# Patient Record
Sex: Male | Born: 1980 | Race: White | Hispanic: No | Marital: Married | Smoking: Never smoker
Health system: Southern US, Community
[De-identification: ages and names within clinical notes are randomized; demographics above are authoritative.]

## PROBLEM LIST (undated history)

## (undated) DIAGNOSIS — K8020 Calculus of gallbladder without cholecystitis without obstruction: Secondary | ICD-10-CM

## (undated) HISTORY — PX: NO PAST SURGERIES: SHX2092

---

## 2020-12-01 ENCOUNTER — Other Ambulatory Visit: Payer: Self-pay

## 2020-12-01 ENCOUNTER — Encounter: Payer: Self-pay | Admitting: Emergency Medicine

## 2020-12-01 ENCOUNTER — Emergency Department: Payer: BC Managed Care – PPO

## 2020-12-01 ENCOUNTER — Emergency Department
Admission: EM | Admit: 2020-12-01 | Discharge: 2020-12-01 | Disposition: A | Discharge status: Home / Self Care | Payer: BC Managed Care – PPO | Attending: Emergency Medicine | Admitting: Emergency Medicine

## 2020-12-01 DIAGNOSIS — K8020 Calculus of gallbladder without cholecystitis without obstruction: Secondary | ICD-10-CM

## 2020-12-01 DIAGNOSIS — R1010 Upper abdominal pain, unspecified: Secondary | ICD-10-CM

## 2020-12-01 DIAGNOSIS — R1013 Epigastric pain: Secondary | ICD-10-CM | POA: Diagnosis present

## 2020-12-01 LAB — COMPREHENSIVE METABOLIC PANEL
ALT: 25 U/L (ref 0–44)
AST: 26 U/L (ref 15–41)
Albumin: 4.2 g/dL (ref 3.5–5.0)
Alkaline Phosphatase: 42 U/L (ref 38–126)
Anion gap: 10 (ref 5–15)
BUN: 12 mg/dL (ref 6–20)
CO2: 23 mmol/L (ref 22–32)
Calcium: 9.2 mg/dL (ref 8.9–10.3)
Chloride: 107 mmol/L (ref 98–111)
Creatinine, Ser: 0.84 mg/dL (ref 0.61–1.24)
GFR, Estimated: >60 mL/min (ref >60)
Glucose, Bld: 123 mg/dL — ABNORMAL HIGH (ref 70–99)
Potassium: 3.6 mmol/L (ref 3.5–5.1)
Sodium: 140 mmol/L (ref 135–145)
Total Bilirubin: 0.7 mg/dL (ref 0.3–1.2)
Total Protein: 7.1 g/dL (ref 6.5–8.1)

## 2020-12-01 LAB — CBC WITH DIFFERENTIAL/PLATELET
Abs Immature Granulocytes: 0.07 10*3/uL (ref 0.00–0.07)
Basophils Absolute: 0.1 10*3/uL (ref 0.0–0.1)
Basophils Relative: 0 %
Eosinophils Absolute: 0.1 10*3/uL (ref 0.0–0.5)
Eosinophils Relative: 1 %
HCT: 44.3 % (ref 39.0–52.0)
Hemoglobin: 15.3 g/dL (ref 13.0–17.0)
Immature Granulocytes: 1 %
Lymphocytes Relative: 14 %
Lymphs Abs: 2.2 10*3/uL (ref 0.7–4.0)
MCH: 30.4 pg (ref 26.0–34.0)
MCHC: 34.5 g/dL (ref 30.0–36.0)
MCV: 88.1 fL (ref 80.0–100.0)
Monocytes Absolute: 1.2 10*3/uL — ABNORMAL HIGH (ref 0.1–1.0)
Monocytes Relative: 8 %
Neutro Abs: 11.9 10*3/uL — ABNORMAL HIGH (ref 1.7–7.7)
Neutrophils Relative %: 76 %
Platelets: 378 10*3/uL (ref 150–400)
RBC: 5.03 MIL/uL (ref 4.22–5.81)
RDW: 12.7 % (ref 11.5–15.5)
WBC: 15.5 10*3/uL — ABNORMAL HIGH (ref 4.0–10.5)
nRBC: 0 % (ref 0.0–0.2)

## 2020-12-01 LAB — LIPASE, BLOOD: Lipase: 33 U/L (ref 11–51)

## 2020-12-01 LAB — TROPONIN I (HIGH SENSITIVITY): Troponin I (High Sensitivity): 5 ng/L (ref <18)

## 2020-12-01 MED ORDER — MORPHINE SULFATE (PF) 4 MG/ML IV SOLN
4.0000 mg | Freq: Once | INTRAVENOUS | Status: AC
  Administered 2020-12-01: 4 mg via INTRAVENOUS
  Filled 2020-12-01: qty 1

## 2020-12-01 MED ORDER — ONDANSETRON HCL 4 MG/2ML IJ SOLN
4.0000 mg | Freq: Once | INTRAMUSCULAR | Status: AC
  Administered 2020-12-01: 4 mg via INTRAVENOUS
  Filled 2020-12-01: qty 2

## 2020-12-01 MED ORDER — HYDROMORPHONE HCL 1 MG/ML IJ SOLN
1.0000 mg | Freq: Once | INTRAMUSCULAR | Status: AC
  Administered 2020-12-01: 1 mg via INTRAVENOUS
  Filled 2020-12-01: qty 1

## 2020-12-01 MED ORDER — ONDANSETRON 4 MG PO TBDP: 4.0000 mg | ORAL_TABLET | Freq: Four times a day (QID) | ORAL | 0 refills | Status: AC | PRN

## 2020-12-01 MED ORDER — SODIUM CHLORIDE 0.9 % IV BOLUS (SEPSIS)
1000.0000 mL | Freq: Once | INTRAVENOUS | Status: AC
  Administered 2020-12-01: 1000 mL via INTRAVENOUS

## 2020-12-01 MED ORDER — OXYCODONE-ACETAMINOPHEN 5-325 MG PO TABS: 2.0000 | ORAL_TABLET | Freq: Four times a day (QID) | ORAL | 0 refills | Status: DC | PRN

## 2020-12-01 MED ORDER — PANTOPRAZOLE SODIUM 40 MG IV SOLR
40.0000 mg | Freq: Once | INTRAVENOUS | Status: AC
  Administered 2020-12-01: 40 mg via INTRAVENOUS
  Filled 2020-12-01: qty 40

## 2020-12-01 NOTE — ED Notes (Signed)
Pt given cup of water at this time. Will reassess in a moment.

## 2020-12-01 NOTE — ED Provider Notes (Signed)
[facility] Emergency Department Provider Note  ____________________________________________   Event Date/Time   First MD Initiated Contact with Patient 12/01/20 [phone]     (approximate)  I have reviewed the triage vital signs and the nursing notes.   HISTORY  Chief Complaint Abdominal Pain    HPI Nicholas Gallagher is a 40 y.o. male with no significant past medical history who presents to the emergency department with epigastric pressure that has been ongoing since 10 AM yesterday.  States he has had these intermittent symptoms for the past 5 months.  He denies any known aggravating or alleviating factors. Has tried TUMs intermittently and pepto bismol tonight without relief.  States today symptoms lasted longer than they normally do.  He reports he did eat [employer] for dinner.  He denies fevers, chest pain, shortness of breath, nausea, vomiting, diarrhea, bloody stools, melena, dysuria, hematuria, penile discharge.  No history of previous abdominal surgeries.    History reviewed. No pertinent past medical history.  There are no problems to display for this patient.   History reviewed. No pertinent surgical history.  Prior to Admission medications   Medication Sig Start Date End Date Taking? Authorizing Provider  ondansetron (ZOFRAN ODT) 4 MG disintegrating tablet Take 1 tablet (4 mg total) by mouth every 6 (six) hours as needed for nausea or vomiting. 12/01/20  Yes Rees Santistevan, Layla Maw, DO  oxyCODONE-acetaminophen (PERCOCET) 5-325 MG tablet Take 2 tablets by mouth every 6 (six) hours as needed for severe pain. 12/01/20 12/01/21 Yes Maebell Lyvers, Layla Maw, DO    Allergies Patient has no known allergies.  History reviewed. No pertinent family history.  Social History    Review of Systems Constitutional: No fever. Eyes: No visual changes. ENT: No sore throat. Cardiovascular: Denies chest pain. Respiratory: Denies shortness of breath. Gastrointestinal: No nausea,  vomiting, diarrhea. Genitourinary: Negative for dysuria. Musculoskeletal: Negative for back pain. Skin: Negative for rash. Neurological: Negative for focal weakness or numbness.  ____________________________________________   PHYSICAL EXAM:  VITAL SIGNS: ED Triage Vitals  Enc Vitals Group     BP 12/01/20 0427 (!) 148/48     Pulse Rate 12/01/20 0427 61     Resp 12/01/20 0427 (!) 22     Temp 12/01/20 0427 98.2 F (36.8 C)     Temp Source 12/01/20 0427 Oral     SpO2 12/01/20 0427 99 %     Weight --      Height --      Head Circumference --      Peak Flow --      Pain Score 12/01/20 0426 7     Pain Loc --      Pain Edu? --      Excl. in GC? --    CONSTITUTIONAL: Alert and oriented and responds appropriately to questions.  Appears uncomfortable but is afebrile, nontoxic. HEAD: Normocephalic EYES: Conjunctivae clear, pupils appear equal, EOM appear intact ENT: normal nose; moist mucous membranes NECK: Supple, normal ROM CARD: RRR; S1 and S2 appreciated; no murmurs, no clicks, no rubs, no gallops RESP: Normal chest excursion without splinting or tachypnea; breath sounds clear and equal bilaterally; no wheezes, no rhonchi, no rales, no hypoxia or respiratory distress, speaking full sentences ABD/GI: Normal bowel sounds; non-distended; soft, ender diffusely throughout the upper abdomen with positive Murphy sign, no tenderness at McBurney's point BACK: The back appears normal EXT: Normal ROM in all joints; no deformity noted, no edema; no cyanosis SKIN: Normal color for age and race; warm;  no rash on exposed skin NEURO: Moves all extremities equally PSYCH: The patient's mood and manner are appropriate.  ____________________________________________   LABS (all labs ordered are listed, but only abnormal results are displayed)  Labs Reviewed  COMPREHENSIVE METABOLIC PANEL - Abnormal; Notable for the following components:      Result Value   Glucose, Bld 123 (*)    All other  components within normal limits  CBC WITH DIFFERENTIAL/PLATELET - Abnormal; Notable for the following components:   WBC 15.5 (*)    Neutro Abs 11.9 (*)    Monocytes Absolute 1.2 (*)    All other components within normal limits  LIPASE, BLOOD  TROPONIN I (HIGH SENSITIVITY)   ____________________________________________  EKG   EKG Interpretation  Date/Time:  Sunday Dec 01 2020 04:32:06 EDT Ventricular Rate:  65 PR Interval:  144 QRS Duration: 86 QT Interval:  414 QTC Calculation: 430 R Axis:   72 Text Interpretation: Sinus rhythm with Premature atrial complexes Nonspecific ST abnormality Abnormal ECG Confirmed by Rochele Raring [phone]) on 12/01/2020 4:44:18 AM       ____________________________________________  RADIOLOGY Normajean Baxter Kitara Hebb, personally viewed and evaluated these images (plain radiographs) as part of my medical decision making, as well as reviewing the written report by the radiologist.  ED MD interpretation: Cholelithiasis, stone at the gallbladder neck, no cholecystitis  Official radiology report(s): US Abdomen Limited RUQ (LIVER/GB)  Result Date: 12/01/2020 CLINICAL DATA:  Epigastric pain EXAM: ULTRASOUND ABDOMEN LIMITED RIGHT UPPER QUADRANT COMPARISON:  None. FINDINGS: Gallbladder: Cholelithiasis including an 8 mm stone at the neck. No gallbladder wall thickening but there is focal tenderness by sonographer exam. No pericholecystic fluid. Common bile duct: Diameter: 5 mm. Liver: Echogenic liver with diminished acoustic penetration. Portal vein is patent on color Doppler imaging with normal direction of blood flow towards the liver. Other: None. IMPRESSION: 1. Cholelithiasis including stone at the gallbladder neck. There is focal tenderness which could reflect gallbladder obstruction, but no wall thickening or edema typical of acute cholecystitis. 2. Hepatic steatosis. Electronically Signed   By: Marnee Spring M.D.   On: 12/01/2020 05:46     ____________________________________________   PROCEDURES  Procedure(s) performed (including Critical Care):  Procedures   ____________________________________________   INITIAL IMPRESSION / ASSESSMENT AND PLAN / ED COURSE  As part of my medical decision making, I reviewed the following data within the electronic MEDICAL RECORD NUMBER Nursing notes reviewed and incorporated, Labs reviewed , Old EKG reviewed, Radiograph reviewed , Notes from prior ED visits and [place] Controlled Substance Database         Patient here with complaints of upper abdominal pain.  Differential includes gastritis, GERD, cholecystitis, cholelithiasis, cholangitis, choledocholithiasis, pancreatitis, ACS.  Low suspicion for PE, dissection.  Doubt appendicitis based on exam.  Will obtain labs, urine, right upper quadrant ultrasound.  Will give IV fluids, pain and nausea medicine as well as Protonix.  EKG nonischemic.  ED PROGRESS  Patient has cholelithiasis with a stone at the gallbladder neck which could reflect gallbladder obstruction but there is no wall thickening, edema consistent with cholecystitis.  He does have a leukocytosis with left shift.  Normal LFTs and lipase.  Reports feeling better after morphine and Dilaudid.  Will discuss with general surgery given concerns for possible gallbladder obstruction.  6:54 AM  Discussed with Dr. Aleen Campi with general surgery.  He feels it is reasonable to allow patient to be discharged home with close outpatient follow-up this week given patient is feeling much better.  Patient is  also comfortable with this plan and would prefer discharge home.  Will p.o. challenge.  Anticipate discharge with pain and nausea medicine, instructions on low-fat diet.  7:30 AM  Pt tolerating p.o.  Patient still requesting discharge home with close outpatient follow-up.  Have offered him admission multiple times which he declines.  We discussed at length return precautions.  He seems very  reliable and will return if things worsen.  Will give outpatient general surgery follow-up information.  Will discharge with Percocet, Zofran.  Recommended low-fat diet.   At this time, I do not feel there is any life-threatening condition present. I have reviewed, interpreted and discussed all results (EKG, imaging, lab, urine as appropriate) and exam findings with patient/family. I have reviewed nursing notes and appropriate previous records.  I feel the patient is safe to be discharged home without further emergent workup and can continue workup as an outpatient as needed. Discussed usual and customary return precautions. Patient/family verbalize understanding and are comfortable with this plan.  Outpatient follow-up has been provided as needed. All questions have been answered.  ____________________________________________   FINAL CLINICAL IMPRESSION(S) / ED DIAGNOSES  Final diagnoses:  Upper abdominal pain  Symptomatic cholelithiasis     ED Discharge Orders         Ordered    oxyCODONE-acetaminophen (PERCOCET) 5-325 MG tablet  Every 6 hours PRN        12/01/20 0734    ondansetron (ZOFRAN ODT) 4 MG disintegrating tablet  Every 6 hours PRN        12/01/20 0734          *Please note:  Kaylub Detienne was evaluated in Emergency Department on 12/01/2020 for the symptoms described in the history of present illness. He was evaluated in the context of the global COVID-19 pandemic, which necessitated consideration that the patient might be at risk for infection with the SARS-CoV-2 virus that causes COVID-19. Institutional protocols and algorithms that pertain to the evaluation of patients at risk for COVID-19 are in a state of rapid change based on information released by regulatory bodies including the CDC and federal and state organizations. These policies and algorithms were followed during the patient's care in the ED.  Some ED evaluations and interventions may be delayed as a result of  limited staffing during and the pandemic.*   Note:  This document was prepared using Dragon voice recognition software and may include unintentional dictation errors.   Thresea Doble, Layla Maw, DO 12/01/20 [phone]

## 2020-12-01 NOTE — Discharge Instructions (Addendum)
Please return to the emergency department if you have worsening uncontrolled abdominal pain, yellowing of your skin or eyes, vomiting that does not stop, fever of 100.4 or higher, chest pain or shortness of breath.  I recommend close follow-up with general surgery as an outpatient.  You are being provided a prescription for opiates (also known as narcotics) for pain control.  Opiates can be addictive and should only be used when absolutely necessary for pain control when other alternatives do not work.  We recommend you only use them for the recommended amount of time and only as prescribed.  Please do not take with other sedative medications or alcohol.  Please do not drive, operate machinery, make important decisions while taking opiates.  Please note that these medications can be addictive and have high abuse potential.  Patients can become addicted to narcotics after only taking them for a few days.  Please keep these medications locked away from children, teenagers or any family members with history of substance abuse.  Narcotic pain medicine may also make you constipated.  You may use over-the-counter medications such as MiraLAX, Colace to prevent constipation.  If you become constipated you may use over-the-counter enemas as needed.  Itching and nausea are common side effects of narcotic pain medication.  If you develop uncontrolled vomiting or a rash, please stop these medications.

## 2020-12-01 NOTE — ED Triage Notes (Addendum)
Intermittent epigastric x69months, states since 1000 5/14 pain has not gone away. No medical history. Denies n/v/d/fevers. Pt very uncomfortable, unable to sit still, clammy skin

## 2020-12-02 ENCOUNTER — Telehealth: Payer: Self-pay

## 2020-12-02 ENCOUNTER — Telehealth: Payer: Self-pay | Admitting: Surgery

## 2020-12-02 NOTE — Telephone Encounter (Signed)
Pt's wife, [name], called attempting to schedule a ED f/u w/Dr. Aleen Campi for gallbladder.  Dr. Aleen Campi is overbooked for Wed w/a 1pm surgery scheduled & she stated she has an appt for him elsewhere on Thr, so she will call back later if needed.  Thank you

## 2020-12-02 NOTE — Telephone Encounter (Signed)
-----   Message from Henrene Dodge, MD sent at 12/01/2020  8:38 AM EDT ----- Regarding: ED referral Hi,  This patient was seen in the ED on 5/15 for abdominal pain, cholelithiasis.  Seems to have been having symptoms intermittently for the past 5 months.  Can y'all contact him for follow up with me sometime this week?  He lives in [place] so maybe he can come tomorrow Monday as the last patient of the morning, or Wed/Fri in [place].  Thanks!  [employer]

## 2020-12-02 NOTE — Telephone Encounter (Signed)
LVM for pt to call the office to schedule an appt for cholelithiasis.

## 2020-12-03 ENCOUNTER — Telehealth: Payer: Self-pay

## 2020-12-03 NOTE — Telephone Encounter (Signed)
Spoke with pt and he states he is going to see Dr. Lemar Livings on Thursday 12/05/20.

## 2020-12-05 ENCOUNTER — Other Ambulatory Visit: Payer: Self-pay | Admitting: General Surgery

## 2020-12-05 NOTE — Progress Notes (Signed)
Subjective:     Patient ID: Nicholas Gallagher is a 40 y.o. male.  HPI  The following portions of the patient's history were reviewed and updated as appropriate.  This a new patient is here today for: office visit. The patient is here today for evaluation of right upper quadrant abdominal pain. He was seen in the ED in the early morning hours of Dec 01, 2020.  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  He returned to [facility] prior to presenting to the emergency room with progressive pain the day after onset.. Patient has been referred by Dr. Ellison Hughs.   On reflection, he states he has had these "attacks" on and off since August last year, although not as severe as this past weekend. He does feel like fatty foods trigger episodes. Denies any nausea or vomiting. He did have a fever up to 101 Tuesday night.  This has not recurred.  Patient has been making use of the brat diet since onset of his symptoms and tolerating this well.  No change in bowel function.  No urinary difficulties.  He has made use of Tylenol or ibuprofen for pain in the last 48 hours, using oxycodone at night to "help sleep".  His neighbor and had surgery under my hand in the past.      Chief Complaint  Patient presents with  . Abdominal Pain    RUQ     BP 132/82   Pulse 85   Temp 36.8 C (98.3 F)   Ht 185.4 cm (6' 1" )   Wt (!) 103.4 kg (228 lb)   SpO2 98%   BMI 30.08 kg/m   History reviewed. No pertinent past medical history.        Past Surgical History:  Procedure Laterality Date  . none         Social History          Socioeconomic History  . Marital status: Married  Tobacco Use  . Smoking status: Never Smoker  . Smokeless tobacco: Never Used  Substance and Sexual Activity  . Alcohol use: Yes    Alcohol/week: 1.0 standard drink    Types: 1 Cans of beer per week  . 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 ondansetron (ZOFRAN-ODT) 4 MG disintegrating tablet     . oxyCODONE-acetaminophen (PERCOCET) 5-325 mg tablet TAKE 2 TABLETS BY MOUTH EVERY 6 HOURS AS NEEDED FOR SEVERE PAIN    . polyethylene glycol (MIRALAX) powder Take 17 g by mouth once daily Mix in 4-8ounces of fluid prior to taking.    . cyclobenzaprine (FLEXERIL) 10 MG tablet Take 1 tablet (10 mg total) by mouth 3 (three) times daily as needed for Muscle spasms. (Patient not taking: No sig reported) 40 tablet 0   No current facility-administered medications for this visit.           Family History  Problem Relation Age of Onset  . High blood pressure (Hypertension) Mother   . Glaucoma Mother   . Diabetes type II Mother   . Diabetes type II Maternal Grandfather  Review of Systems  Constitutional: Negative for chills and fever.  Respiratory: Negative for cough.        Objective:   Physical Exam Constitutional:      Appearance: Normal appearance.  Eyes:     Conjunctiva/sclera: Conjunctivae normal.  Cardiovascular:     Rate and Rhythm: Normal rate and regular rhythm.     Pulses: Normal pulses.     Heart sounds: Normal heart sounds.  Pulmonary:     Effort: Pulmonary effort is normal.     Breath sounds: Normal breath sounds.  Abdominal:     General: Abdomen is flat. Bowel sounds are normal.     Palpations: Abdomen is soft.     Tenderness: There is abdominal tenderness in the right upper quadrant.       Comments: Fullness and tenderness in the right upper quadrant suggesting omentum surrounding the gallbladder.  Mild focal tenderness.  No peritoneal irritation, guarding or referred pain.  Neurological:     Mental  Status: He is alert and oriented to person, place, and time.  Psychiatric:        Mood and Affect: Mood normal.        Behavior: Behavior normal.    Labs and Radiology:   Abdominal ultrasound dated Dec 01, 2020:  IMPRESSION: 1. Cholelithiasis including stone at the gallbladder neck. There is focal tenderness which could reflect gallbladder obstruction, but no wall thickening or edema typical of acute cholecystitis. 2. Hepatic steatosis.  Dec 03, 2020 laboratory:  WBC [facility] Blood Cell Count) 4.1 - 10.2 10^3/uL 14.9High   RBC (Red Blood Cell Count) 4.69 - 6.13 10^6/uL 5.19   Hemoglobin 14.1 - 18.1 gm/dL 15.9   Hematocrit 40.0 - 52.0 % 46.1   MCV (Mean Corpuscular Volume) 80.0 - 100.0 fl 88.8   MCH (Mean Corpuscular Hemoglobin) 27.0 - 31.2 pg 30.6   MCHC (Mean Corpuscular Hemoglobin Concentration) 32.0 - 36.0 gm/dL 34.5   Platelet Count 150 - 450 10^3/uL 361   RDW-CV (Red Cell Distribution Width) 11.6 - 14.8 % 12.3   MPV (Mean Platelet Volume) 9.4 - 12.4 fl 9.2Low   Neutrophils 1.50 - 7.80 10^3/uL 10.60High   Lymphocytes 1.00 - 3.60 10^3/uL 2.10   Mixed Count 0.10 - 0.90 10^3/uL 2.20High   Neutrophil % 32.0 - 70.0 % 71.4High   Lymphocyte % 10.0 - 50.0 % 14.0   Mixed % 3.0 - 14.4 % 14.6High    Glucose 70 - 110 mg/dL 102   Sodium 136 - 145 mmol/L 137   Potassium 3.6 - 5.1 mmol/L 4.4   Chloride 97 - 109 mmol/L 96Low   Carbon Dioxide (CO2) 22.0 - 32.0 mmol/L 34.7High   Urea Nitrogen (BUN) 7 - 25 mg/dL 11   Creatinine 0.7 - 1.3 mg/dL 1.0   Glomerular Filtration Rate (eGFR), MDRD Estimate >60 mL/min/1.73sq m 82   Calcium 8.7 - 10.3 mg/dL 9.3   AST  8 - 39 U/L 33   ALT  6 - 57 U/L 54   Alk Phos (alkaline Phosphatase) 34 - 104 U/L 77   Albumin 3.5 - 4.8 g/dL 4.4   Bilirubin, Total 0.3 - 1.2 mg/dL 1.2   Protein, Total 6.1 - 7.9 g/dL 7.3   A/G Ratio 1.0 - 5.0 gm/dL 1.5    Lipase 11 - 82 U/L 18      Dec 01, 2020 laboratory:  WBC 4.0 - 10.5 K/uL  15.5High   RBC 4.22 - 5.81 MIL/uL 5.03   Hemoglobin 13.0 - 17.0 g/dL  15.3   HCT 39.0 - 52.0 % 44.3   MCV 80.0 - 100.0 fL 88.1   MCH 26.0 - 34.0 pg 30.4   MCHC 30.0 - 36.0 g/dL 34.5   RDW 11.5 - 15.5 % 12.7   Platelets 150 - 400 K/uL 378   nRBC 0.0 - 0.2 % 0.0   Neutrophils Relative % % 76   Neutro Abs 1.7 - 7.7 K/uL 11.9High   Lymphocytes Relative % 14   Lymphs Abs 0.7 - 4.0 K/uL 2.2   Monocytes Relative % 8   Monocytes Absolute 0.1 - 1.0 K/uL 1.2High   Eosinophils Relative % 1   Eosinophils Absolute 0.0 - 0.5 K/uL 0.1   Basophils Relative % 0   Basophils Absolute 0.0 - 0.1 K/uL 0.1   Immature Granulocytes % 1   Abs Immature Granulocytes 0.00 - 0.07 K/uL 0.07   Comment: Performed at [facility], [address] [place], [place] [address]   Sodium 135 - 145 mmol/L 140   Potassium 3.5 - 5.1 mmol/L 3.6   Chloride 98 - 111 mmol/L 107   CO2 22 - 32 mmol/L 23   Glucose, Bld 70 - 99 mg/dL 123High   Comment: Glucose reference range applies only to samples taken after fasting for at least 8 hours.  BUN 6 - 20 mg/dL 12   Creatinine, Ser 0.61 - 1.24 mg/dL 0.84   Calcium 8.9 - 10.3 mg/dL 9.2   Total Protein 6.5 - 8.1 g/dL 7.1   Albumin 3.5 - 5.0 g/dL 4.2   AST 15 - 41 U/L 26   ALT 0 - 44 U/L 25   Alkaline Phosphatase 38 - 126 U/L 42   Total Bilirubin 0.3 - 1.2 mg/dL 0.7   GFR, Estimated >60 mL/min >60   Comment: (NOTE)  Calculated using the CKD-EPI Creatinine Equation (2021)        Assessment:     Acute on chronic cholecystitis, now day 5.    Plan:     This is the worst time to intervene operatively, especially considering the likely inflammatory phlegmon in the right upper quadrant.  As the patient is tolerating his diet well, has a slightly diminished white blood cell count left shift, I think we can work for a couple of days of "cooling off".  While I normally would give this a few weeks to quiet down, he taken off next week and had plans for travel out  of state to a memorial in [place] [facility].  The patient has been asked to make use of either Aleve 2 tablets twice a day or ibuprofen 4 tablets 3 times a day for the next few days to see if we get the inflammatory process quiescent.  As he has had no recurrent fevers I do not think antibiotics are required.  The role of operative intervention is appropriate considering his young age and long history of symptoms over the last 9 months.  The plan for a laparoscopic procedure was reviewed, he is aware that if necessary an open procedure will be undertaken.  Anticipated return to work in about 7 days.  Driving when pain-free.  He was encouraged to call if he becomes more symptomatic between now and his scheduled surgery date on May 25, otherwise we will get together at that time.     This note is partially prepared by Ledell Noss, CMA acting as a scribe in the presence of Dr. Hervey Ard, MD.  This note is partially  prepared by Karie Fetch, RN, acting as a scribe in the presence of Dr. Hervey Ard, MD.   The documentation recorded by the scribe accurately reflects the service I personally performed and the decisions made by me.   Robert Bellow, MD FACS

## 2020-12-10 ENCOUNTER — Other Ambulatory Visit: Payer: Self-pay

## 2020-12-10 ENCOUNTER — Other Ambulatory Visit
Admission: RE | Admit: 2020-12-10 | Discharge: 2020-12-10 | Disposition: A | Discharge status: Home / Self Care | Payer: BC Managed Care – PPO | Source: Ambulatory Visit | Attending: General Surgery | Admitting: General Surgery

## 2020-12-10 HISTORY — DX: Calculus of gallbladder without cholecystitis without obstruction: K80.20

## 2020-12-10 MED ORDER — CEFAZOLIN SODIUM-DEXTROSE 2-4 GM/100ML-% IV SOLN
2.0000 g | INTRAVENOUS | Status: AC
  Administered 2020-12-11: 2 g via INTRAVENOUS

## 2020-12-10 MED ORDER — CHLORHEXIDINE GLUCONATE 0.12 % MT SOLN: 15.0000 mL | Freq: Once | OROMUCOSAL | Status: AC

## 2020-12-10 MED ORDER — CHLORHEXIDINE GLUCONATE CLOTH 2 % EX PADS: 6.0000 | MEDICATED_PAD | Freq: Once | CUTANEOUS | Status: DC

## 2020-12-10 MED ORDER — CHLORHEXIDINE GLUCONATE CLOTH 2 % EX PADS
6.0000 | MEDICATED_PAD | Freq: Once | CUTANEOUS | Status: DC
Start: 2020-12-10 — End: 2020-12-11

## 2020-12-10 MED ORDER — LACTATED RINGERS IV SOLN: INTRAVENOUS | Status: DC

## 2020-12-10 MED ORDER — ORAL CARE MOUTH RINSE: 15.0000 mL | Freq: Once | OROMUCOSAL | Status: AC

## 2020-12-10 MED ORDER — FAMOTIDINE 20 MG PO TABS: 20.0000 mg | ORAL_TABLET | Freq: Once | ORAL | Status: DC

## 2020-12-10 NOTE — Patient Instructions (Addendum)
Your procedure is scheduled on: Wednesday, May 25 Report to the Registration Desk on the 1st floor of the [facility] To find out your arrival time, please call [phone] between 1PM - 3PM on: Tuesday, May 24  REMEMBER: Instructions that are not followed completely may result in serious medical risk, up to and including death; or upon the discretion of your surgeon and anesthesiologist your surgery may need to be rescheduled.  Do not eat food after midnight the night before surgery.  No gum chewing, lozengers or hard candies.  You may however, drink CLEAR liquids up to 2 hours before you are scheduled to arrive for your surgery. Do not drink anything within 2 hours of your scheduled arrival time.  Clear liquids include: - water  - apple juice without pulp - gatorade (not RED, PURPLE, OR BLUE) - black coffee or tea (Do NOT add milk or creamers to the coffee or tea) Do NOT drink anything that is not on this list.  DO NOT TAKE ANY MEDICATIONS THE MORNING OF SURGERY   One week prior to surgery: Stop Anti-inflammatories (NSAIDS) such as Advil, Aleve, Ibuprofen, Motrin, Naproxen, Naprosyn and Aspirin based products such as Excedrin, Goodys Powder, BC Powder. Stop ANY OVER THE COUNTER supplements until after surgery.  No Alcohol for 24 hours before or after surgery.  No Smoking including e-cigarettes for 24 hours prior to surgery.  No chewable tobacco products for at least 6 hours prior to surgery.  No nicotine patches on the day of surgery.  Do not use any "recreational" drugs for at least a week prior to your surgery.  Please be advised that the combination of cocaine and anesthesia may have negative outcomes, up to and including death. If you test positive for cocaine, your surgery will be cancelled.  On the morning of surgery brush your teeth with toothpaste and water, you may rinse your mouth with mouthwash if you wish. Do not swallow any toothpaste or mouthwash.  Do not  wear jewelry, make-up, hairpins, clips or nail polish.  Do not wear lotions, powders, or perfumes.   Do not shave body from the neck down 48 hours prior to surgery just in case you cut yourself which could leave a site for infection.   Contact lenses, hearing aids and dentures may not be worn into surgery.  Do not bring valuables to the hospital. [facility] is not responsible for any missing/lost belongings or valuables.   Notify your doctor if there is any change in your medical condition (cold, fever, infection).  Wear comfortable clothing (specific to your surgery type) to the hospital.  Plan for stool softeners for home use; pain medications have a tendency to cause constipation. You can also help prevent constipation by eating foods high in fiber such as fruits and vegetables and drinking plenty of fluids as your diet allows.  After surgery, you can help prevent lung complications by doing breathing exercises.  Take deep breaths and cough every 1-2 hours. Your doctor may order a device called an Incentive Spirometer to help you take deep breaths. When coughing or sneezing, hold a pillow firmly against your incision with both hands. This is called "splinting." Doing this helps protect your incision. It also decreases belly discomfort.  If you are being discharged the day of surgery, you will not be allowed to drive home. You will need a responsible adult (18 years or older) to drive you home and stay with you that night.   If you are  taking public transportation, you will need to have a responsible adult (18 years or older) with you. Please confirm with your physician that it is acceptable to use public transportation.   Please call the [facility] at [phone] if you have any questions about these instructions.  Surgery Visitation Policy:  Patients undergoing a surgery or procedure may have one family member or support person with them as long as that person  is not COVID-19 positive or experiencing its symptoms.  That person may remain in the waiting area during the procedure.

## 2020-12-11 ENCOUNTER — Other Ambulatory Visit: Payer: Self-pay

## 2020-12-11 ENCOUNTER — Ambulatory Visit: Payer: BC Managed Care – PPO | Admitting: Urgent Care

## 2020-12-11 ENCOUNTER — Inpatient Hospital Stay
Admission: EM | Admit: 2020-12-11 | Discharge: 2020-12-21 | DRG: 939 | Disposition: A | Discharge status: Home / Self Care | Payer: BC Managed Care – PPO | Attending: General Surgery | Admitting: General Surgery

## 2020-12-11 ENCOUNTER — Encounter
Admission: RE | Disposition: A | Discharge status: Home / Self Care | Payer: Self-pay | Source: Home / Self Care | Attending: General Surgery

## 2020-12-11 ENCOUNTER — Encounter: Payer: Self-pay | Admitting: General Surgery

## 2020-12-11 ENCOUNTER — Ambulatory Visit: Payer: BC Managed Care – PPO | Admitting: Anesthesiology

## 2020-12-11 ENCOUNTER — Ambulatory Visit
Admission: RE | Admit: 2020-12-11 | Discharge: 2020-12-11 | Disposition: A | Discharge status: Home / Self Care | Payer: BC Managed Care – PPO | Source: Home / Self Care | Attending: General Surgery | Admitting: General Surgery

## 2020-12-11 DIAGNOSIS — T402X5A Adverse effect of other opioids, initial encounter: Secondary | ICD-10-CM | POA: Diagnosis not present

## 2020-12-11 DIAGNOSIS — G8929 Other chronic pain: Secondary | ICD-10-CM

## 2020-12-11 DIAGNOSIS — K8012 Calculus of gallbladder with acute and chronic cholecystitis without obstruction: Secondary | ICD-10-CM | POA: Insufficient documentation

## 2020-12-11 DIAGNOSIS — K5903 Drug induced constipation: Secondary | ICD-10-CM | POA: Diagnosis not present

## 2020-12-11 DIAGNOSIS — Y838 Other surgical procedures as the cause of abnormal reaction of the patient, or of later complication, without mention of misadventure at the time of the procedure: Secondary | ICD-10-CM | POA: Diagnosis present

## 2020-12-11 DIAGNOSIS — R7989 Other specified abnormal findings of blood chemistry: Secondary | ICD-10-CM

## 2020-12-11 DIAGNOSIS — K858 Other acute pancreatitis without necrosis or infection: Secondary | ICD-10-CM | POA: Diagnosis not present

## 2020-12-11 DIAGNOSIS — J90 Pleural effusion, not elsewhere classified: Secondary | ICD-10-CM | POA: Diagnosis not present

## 2020-12-11 DIAGNOSIS — G8918 Other acute postprocedural pain: Principal | ICD-10-CM | POA: Diagnosis present

## 2020-12-11 DIAGNOSIS — K8019 Calculus of gallbladder with other cholecystitis with obstruction: Secondary | ICD-10-CM

## 2020-12-11 DIAGNOSIS — K567 Ileus, unspecified: Secondary | ICD-10-CM

## 2020-12-11 DIAGNOSIS — K838 Other specified diseases of biliary tract: Secondary | ICD-10-CM

## 2020-12-11 DIAGNOSIS — K9189 Other postprocedural complications and disorders of digestive system: Secondary | ICD-10-CM | POA: Diagnosis not present

## 2020-12-11 DIAGNOSIS — Z79899 Other long term (current) drug therapy: Secondary | ICD-10-CM

## 2020-12-11 DIAGNOSIS — R1011 Right upper quadrant pain: Secondary | ICD-10-CM

## 2020-12-11 DIAGNOSIS — J9811 Atelectasis: Secondary | ICD-10-CM

## 2020-12-11 DIAGNOSIS — R0902 Hypoxemia: Secondary | ICD-10-CM | POA: Diagnosis not present

## 2020-12-11 DIAGNOSIS — E871 Hypo-osmolality and hyponatremia: Secondary | ICD-10-CM | POA: Diagnosis not present

## 2020-12-11 DIAGNOSIS — Y733 Surgical instruments, materials and gastroenterology and urology devices (including sutures) associated with adverse incidents: Secondary | ICD-10-CM | POA: Diagnosis present

## 2020-12-11 DIAGNOSIS — Z20822 Contact with and (suspected) exposure to covid-19: Secondary | ICD-10-CM | POA: Diagnosis present

## 2020-12-11 DIAGNOSIS — Z419 Encounter for procedure for purposes other than remedying health state, unspecified: Secondary | ICD-10-CM

## 2020-12-11 HISTORY — PX: CHOLECYSTECTOMY: SHX55

## 2020-12-11 SURGERY — LAPAROSCOPIC CHOLECYSTECTOMY WITH INTRAOPERATIVE CHOLANGIOGRAM
Anesthesia: General | Site: Abdomen

## 2020-12-11 MED ORDER — SUGAMMADEX SODIUM 200 MG/2ML IV SOLN
INTRAVENOUS | Status: DC | PRN
  Administered 2020-12-11 (×2): 200 mg via INTRAVENOUS

## 2020-12-11 MED ORDER — FENTANYL CITRATE (PF) 100 MCG/2ML IJ SOLN
INTRAMUSCULAR | Status: DC | PRN
  Administered 2020-12-11 (×3): 50 ug via INTRAVENOUS

## 2020-12-11 MED ORDER — LIDOCAINE HCL (CARDIAC) PF 100 MG/5ML IV SOSY
PREFILLED_SYRINGE | INTRAVENOUS | Status: DC | PRN
  Administered 2020-12-11: 80 mg via INTRAVENOUS

## 2020-12-11 MED ORDER — MIDAZOLAM HCL 2 MG/2ML IJ SOLN
INTRAMUSCULAR | Status: DC | PRN
  Administered 2020-12-11: 2 mg via INTRAVENOUS

## 2020-12-11 MED ORDER — FENTANYL CITRATE (PF) 100 MCG/2ML IJ SOLN
INTRAMUSCULAR | Status: AC
  Administered 2020-12-11: 50 ug via INTRAVENOUS
  Filled 2020-12-11: qty 2

## 2020-12-11 MED ORDER — ACETAMINOPHEN 10 MG/ML IV SOLN
INTRAVENOUS | Status: DC | PRN
  Administered 2020-12-11: 1000 mg via INTRAVENOUS

## 2020-12-11 MED ORDER — CHLORHEXIDINE GLUCONATE 0.12 % MT SOLN
OROMUCOSAL | Status: AC
  Administered 2020-12-11: 15 mL via OROMUCOSAL
  Filled 2020-12-11: qty 15

## 2020-12-11 MED ORDER — FAMOTIDINE 20 MG PO TABS
ORAL_TABLET | ORAL | Status: AC
  Filled 2020-12-11: qty 1

## 2020-12-11 MED ORDER — FENTANYL CITRATE (PF) 100 MCG/2ML IJ SOLN
25.0000 ug | INTRAMUSCULAR | Status: DC | PRN
  Administered 2020-12-11 (×3): 25 ug via INTRAVENOUS

## 2020-12-11 MED ORDER — KETOROLAC TROMETHAMINE 30 MG/ML IJ SOLN
INTRAMUSCULAR | Status: DC | PRN
  Administered 2020-12-11: 30 mg via INTRAVENOUS

## 2020-12-11 MED ORDER — OXYCODONE HCL 5 MG/5ML PO SOLN
5.0000 mg | Freq: Once | ORAL | Status: AC | PRN
Start: 2020-12-11 — End: 2020-12-11

## 2020-12-11 MED ORDER — ACETAMINOPHEN 10 MG/ML IV SOLN: 1000.0000 mg | Freq: Once | INTRAVENOUS | Status: DC | PRN

## 2020-12-11 MED ORDER — LACTATED RINGERS IV SOLN: INTRAVENOUS | Status: DC | PRN

## 2020-12-11 MED ORDER — ONDANSETRON HCL 4 MG/2ML IJ SOLN
INTRAMUSCULAR | Status: DC | PRN
  Administered 2020-12-11: 4 mg via INTRAVENOUS

## 2020-12-11 MED ORDER — OXYCODONE HCL 5 MG PO TABS
ORAL_TABLET | ORAL | Status: AC
  Administered 2020-12-11: 5 mg via ORAL
  Filled 2020-12-11: qty 1

## 2020-12-11 MED ORDER — FENTANYL CITRATE (PF) 100 MCG/2ML IJ SOLN
INTRAMUSCULAR | Status: AC
  Administered 2020-12-11: 25 ug via INTRAVENOUS
  Filled 2020-12-11: qty 2

## 2020-12-11 MED ORDER — HYDROMORPHONE HCL 1 MG/ML IJ SOLN
0.5000 mg | Freq: Once | INTRAMUSCULAR | Status: AC
  Administered 2020-12-12: 0.5 mg via INTRAVENOUS
  Filled 2020-12-11: qty 1

## 2020-12-11 MED ORDER — PROPOFOL 10 MG/ML IV BOLUS
INTRAVENOUS | Status: DC | PRN
  Administered 2020-12-11: 200 mg via INTRAVENOUS

## 2020-12-11 MED ORDER — ONDANSETRON HCL 4 MG/2ML IJ SOLN
4.0000 mg | Freq: Once | INTRAMUSCULAR | Status: AC
  Administered 2020-12-12: 4 mg via INTRAVENOUS
  Filled 2020-12-11: qty 2

## 2020-12-11 MED ORDER — CIPROFLOXACIN HCL 500 MG PO TABS: 500.0000 mg | ORAL_TABLET | Freq: Two times a day (BID) | ORAL | 0 refills | Status: DC

## 2020-12-11 MED ORDER — DEXAMETHASONE SODIUM PHOSPHATE 10 MG/ML IJ SOLN
INTRAMUSCULAR | Status: DC | PRN
  Administered 2020-12-11: 10 mg via INTRAVENOUS

## 2020-12-11 MED ORDER — CEFAZOLIN SODIUM-DEXTROSE 2-4 GM/100ML-% IV SOLN
INTRAVENOUS | Status: AC
  Filled 2020-12-11: qty 100

## 2020-12-11 MED ORDER — FENTANYL CITRATE (PF) 100 MCG/2ML IJ SOLN
50.0000 ug | Freq: Once | INTRAMUSCULAR | Status: AC
Start: 2020-12-11 — End: 2020-12-11

## 2020-12-11 MED ORDER — SODIUM CHLORIDE (PF) 0.9 % IJ SOLN
INTRAMUSCULAR | Status: AC
  Filled 2020-12-11: qty 50

## 2020-12-11 MED ORDER — OXYCODONE HCL 5 MG PO TABS: 5.0000 mg | ORAL_TABLET | Freq: Once | ORAL | Status: AC | PRN

## 2020-12-11 MED ORDER — ROCURONIUM BROMIDE 100 MG/10ML IV SOLN
INTRAVENOUS | Status: DC | PRN
  Administered 2020-12-11: 20 mg via INTRAVENOUS
  Administered 2020-12-11: 60 mg via INTRAVENOUS

## 2020-12-11 MED ORDER — ONDANSETRON HCL 4 MG/2ML IJ SOLN
4.0000 mg | Freq: Once | INTRAMUSCULAR | Status: AC | PRN
  Administered 2020-12-11: 4 mg via INTRAVENOUS

## 2020-12-11 MED ORDER — ONDANSETRON HCL 4 MG/2ML IJ SOLN
INTRAMUSCULAR | Status: AC
  Filled 2020-12-11: qty 2

## 2020-12-11 MED ORDER — SODIUM CHLORIDE 0.9 % IV BOLUS
1000.0000 mL | Freq: Once | INTRAVENOUS | Status: AC
  Administered 2020-12-12: 1000 mL via INTRAVENOUS

## 2020-12-11 SURGICAL SUPPLY — 44 items
APL PRP STRL LF DISP 70% ISPRP (MISCELLANEOUS) ×1
APPLIER CLIP ROT 10 11.4 M/L (STAPLE) ×2
APR CLP MED LRG 11.4X10 (STAPLE) ×1
BAG SPEC RTRVL LRG 6X4 10 (ENDOMECHANICALS)
BLADE SURG 11 STRL SS SAFETY (MISCELLANEOUS) ×2 IMPLANT
CANISTER SUCT 1200ML W/VALVE (MISCELLANEOUS) ×2 IMPLANT
CANNULA DILATOR 10 W/SLV (CANNULA) ×2 IMPLANT
CANNULA DILATOR 5 W/SLV (CANNULA) ×5 IMPLANT
CATH CHOLANG 76X19 KUMAR (CATHETERS) ×2 IMPLANT
CHLORAPREP W/TINT 26 (MISCELLANEOUS) ×2 IMPLANT
CLIP APPLIE ROT 10 11.4 M/L (STAPLE) IMPLANT
COVER WAND RF STERILE (DRAPES) ×2 IMPLANT
DRAPE C-ARM 42X70 (DRAPES) ×2 IMPLANT
DRSG TEGADERM 2-3/8X2-3/4 SM (GAUZE/BANDAGES/DRESSINGS) ×8 IMPLANT
DRSG TELFA 4X3 1S NADH ST (GAUZE/BANDAGES/DRESSINGS) ×2 IMPLANT
ELECT REM PT RETURN 9FT ADLT (ELECTROSURGICAL) ×2
ELECTRODE REM PT RTRN 9FT ADLT (ELECTROSURGICAL) ×1 IMPLANT
GLOVE SURG ENC MOIS LTX SZ7.5 (GLOVE) ×2 IMPLANT
GLOVE SURG UNDER LTX SZ8 (GLOVE) ×2 IMPLANT
GOWN STRL REUS W/ TWL LRG LVL3 (GOWN DISPOSABLE) ×3 IMPLANT
GOWN STRL REUS W/TWL LRG LVL3 (GOWN DISPOSABLE) ×6
IRRIGATION STRYKERFLOW (MISCELLANEOUS) ×1 IMPLANT
IRRIGATOR STRYKERFLOW (MISCELLANEOUS) ×2
IV LACTATED RINGERS 1000ML (IV SOLUTION) ×2 IMPLANT
KIT TURNOVER KIT A (KITS) ×2 IMPLANT
KITTNER LAPARASCOPIC 5X40 (MISCELLANEOUS) ×1 IMPLANT
LABEL OR SOLS (LABEL) ×2 IMPLANT
MANIFOLD NEPTUNE II (INSTRUMENTS) ×2 IMPLANT
NDL INSUFF ACCESS 14 VERSASTEP (NEEDLE) ×2 IMPLANT
NS IRRIG 500ML POUR BTL (IV SOLUTION) ×2 IMPLANT
PACK LAP CHOLECYSTECTOMY (MISCELLANEOUS) ×2 IMPLANT
PDS 0 ct-2 ×1 IMPLANT
POUCH SPECIMEN RETRIEVAL 10MM (ENDOMECHANICALS) IMPLANT
SCISSORS METZENBAUM CVD 33 (INSTRUMENTS) ×2 IMPLANT
SET TUBE SMOKE EVAC HIGH FLOW (TUBING) ×2 IMPLANT
SLEEVE VERSASTEP EXPAND ONEST (MISCELLANEOUS) ×1 IMPLANT
SPONGE KITTNER 5P (MISCELLANEOUS) IMPLANT
STRIP CLOSURE SKIN 1/2X4 (GAUZE/BANDAGES/DRESSINGS) ×2 IMPLANT
SUT VIC AB 0 CT2 27 (SUTURE) IMPLANT
SUT VIC AB 4-0 FS2 27 (SUTURE) ×2 IMPLANT
SWAB CULTURE AMIES ANAERIB BLU (MISCELLANEOUS) ×1 IMPLANT
SWABSTK COMLB BENZOIN TINCTURE (MISCELLANEOUS) ×2 IMPLANT
TROCAR XCEL NON-BLD 11X100MML (ENDOMECHANICALS) ×2 IMPLANT
WATER STERILE IRR 1000ML POUR (IV SOLUTION) ×2 IMPLANT

## 2020-12-11 NOTE — ED Triage Notes (Signed)
Pt had gall bladder removed today here for worsening abd pain and right neck pain.

## 2020-12-11 NOTE — Transfer of Care (Signed)
Immediate Anesthesia Transfer of Care Note  Patient: Nicholas Gallagher  Procedure(s) Performed: LAPAROSCOPIC CHOLECYSTECTOMY WITH INTRAOPERATIVE CHOLANGIOGRAM (N/A Abdomen)  Patient Location: PACU  Anesthesia Type:General  Level of Consciousness: awake, drowsy and patient cooperative  Airway & Oxygen Therapy: Patient Spontanous Breathing  Post-op Assessment: Report given to RN and Post -op Vital signs reviewed and stable  Post vital signs: Reviewed and stable  Last Vitals:  Vitals Value Taken Time  BP 140/95 12/11/20 1510  Temp    Pulse 87 12/11/20 1514  Resp 0 12/11/20 1514  SpO2 97 % 12/11/20 1514  Vitals shown include unvalidated device data.  Last Pain:  Vitals:   12/11/20 1202  TempSrc:   PainSc: 1       Patients Stated Pain Goal: 2 (12/11/20 1130)  Complications: No complications documented.

## 2020-12-11 NOTE — Discharge Instructions (Signed)
AMBULATORY SURGERY  °DISCHARGE INSTRUCTIONS ° ° °1) The drugs that you were given will stay in your system until tomorrow so for the next 24 hours you should not: ° °A) Drive an automobile °B) Make any legal decisions °C) Drink any alcoholic beverage ° ° °2) You may resume regular meals tomorrow.  Today it is better to start with liquids and gradually work up to solid foods. ° °You may eat anything you prefer, but it is better to start with liquids, then soup and crackers, and gradually work up to solid foods. ° ° °3) Please notify your doctor immediately if you have any unusual bleeding, trouble breathing, redness and pain at the surgery site, drainage, fever, or pain not relieved by medication. ° ° ° °4) Additional Instructions: ° ° ° ° ° ° ° °Please contact your physician with any problems or Same Day Surgery at [phone], Monday through Friday 6 am to 4 pm, or [place] [facility] [place] Main number at [phone]. °

## 2020-12-11 NOTE — ED Provider Notes (Signed)
[facility] Emergency Department Provider Note  ____________________________________________   Event Date/Time   First MD Initiated Contact with Patient 12/11/20 2343     (approximate)  I have reviewed the triage vital signs and the nursing notes.   HISTORY  Chief Complaint Abdominal Pain    HPI Nicholas Gallagher is a 40 y.o. male who underwent laparoscopic cholecystectomy today by Dr. Lemar Livings.  Patient states that his surgery was done today.  He was discharged around 5 PM.  He states that he has taken 2 of the oxycodone but continued to have severe pain mostly in the right upper quadrant, constant, nothing makes better, nothing makes it worse.  States the pain was so severe he now thinks that he sprained something in his neck.  He does not feel short of breath but states that he is just splinting due to how bad the pain is.  Patient reports urinating since surgery.  He denies bowel movement yet.  He is not sure if he is passing gas.          Past Medical History:  Diagnosis Date  . Gallstones     There are no problems to display for this patient.   Past Surgical History:  Procedure Laterality Date  . NO PAST SURGERIES      Prior to Admission medications   Medication Sig Start Date End Date Taking? Authorizing Provider  ciprofloxacin (CIPRO) 500 MG tablet Take 1 tablet (500 mg total) by mouth 2 (two) times daily for 7 days. 12/11/20 12/18/20  Earline Mayotte, MD  Multiple Vitamins-Minerals (ADULT GUMMY PO) Take 2 capsules by mouth daily.    [provider]  naproxen sodium (ALEVE) 220 MG tablet Take 220 mg by mouth 2 (two) times daily as needed (pain).    [provider]  ondansetron (ZOFRAN ODT) 4 MG disintegrating tablet Take 1 tablet (4 mg total) by mouth every 6 (six) hours as needed for nausea or vomiting. Patient not taking: No sig reported 12/01/20   Ward, Layla Maw, DO  oxyCODONE-acetaminophen (PERCOCET) 5-325 MG tablet Take  2 tablets by mouth every 6 (six) hours as needed for severe pain. Patient not taking: No sig reported 12/01/20 12/01/21  Ward, Layla Maw, DO    Allergies Patient has no known allergies.  No family history on file.  Social History Social History   Tobacco Use  . Smoking status: Never Smoker  . Smokeless tobacco: Never Used  Vaping Use  . Vaping Use: Never used  Substance Use Topics  . Alcohol use: Yes    Comment: occassional  . Drug use: Never      Review of Systems Constitutional: No fever/chills Eyes: No visual changes. ENT: No sore throat. Cardiovascular: Denies chest pain. Respiratory: Denies shortness of breath. Gastrointestinal: Positive abdominal pain Genitourinary: Negative for dysuria. Musculoskeletal: Negative for back pain.  Right shoulder pain Skin: Negative for rash. Neurological: Negative for headaches, focal weakness or numbness. All other ROS negative ____________________________________________   PHYSICAL EXAM:  VITAL SIGNS: Blood pressure (!) 173/113, pulse (!) 112, temperature 97.9 F (36.6 C), temperature source Oral, resp. rate 20, height 6\' 1"  (1.854 m), weight 102.1 kg, SpO2 99 %.  Constitutional: Alert and oriented. Well appearing and in no acute distress. Eyes: Conjunctivae are normal. EOMI. Head: Atraumatic. Nose: No congestion/rhinnorhea. Mouth/Throat: Mucous membranes are moist.   Neck: No stridor. Trachea Midline. FROM some right frontal neck pain going down into the right shoulder Cardiovascular: Tachycardic, regular rhythm. Grossly normal  heart sounds.  Good peripheral circulation. Respiratory: Normal respiratory effort.  No retractions. Lungs CTAB. Gastrointestinal: Right upper quadrant pain.  No distention. No abdominal bruits.  Musculoskeletal: No lower extremity tenderness nor edema.  No joint effusions. Neurologic:  Normal speech and language. No gross focal neurologic deficits are appreciated.  Skin:  Skin is warm, dry and  intact. No rash noted. Psychiatric: Mood and affect are normal. Speech and behavior are normal. GU: Deferred   ____________________________________________   LABS (all labs ordered are listed, but only abnormal results are displayed)  Labs Reviewed  CBC WITH DIFFERENTIAL/PLATELET - Abnormal; Notable for the following components:      Result Value   WBC 12.3 (*)    MCHC 36.3 (*)    Platelets 662 (*)    Neutro Abs 10.4 (*)    Abs Immature Granulocytes 0.08 (*)    All other components within normal limits  COMPREHENSIVE METABOLIC PANEL  LIPASE, BLOOD  URINALYSIS, COMPLETE (UACMP) WITH MICROSCOPIC  TROPONIN I (HIGH SENSITIVITY)   ____________________________________________   ED ECG REPORT I, Concha Se, the attending physician, personally viewed and interpreted this ECG.  Sinus tachycardia rate of 104, no ST elevation, no T wave inversions, normal intervals ____________________________________________  RADIOLOGY Vela Prose, personally viewed and evaluated these images (plain radiographs) as part of my medical decision making, as well as reviewing the written report by the radiologist.  ED MD interpretation:  atelectasis   Official radiology report(s): CT ABDOMEN PELVIS WO CONTRAST  Result Date: 12/12/2020 CLINICAL DATA:  Abdominal distension. Gallbladder removed today with worsening abdominal pain and right neck pain. Postop day 0 EXAM: CT ABDOMEN AND PELVIS WITHOUT CONTRAST TECHNIQUE: Multidetector CT imaging of the abdomen and pelvis was performed following the standard protocol without IV contrast. COMPARISON:  Ultrasound abdomen 12/01/2020 FINDINGS: Lower chest: Bilateral lower lobe subsegmental atelectasis/consolidations, right greater than left. Hepatobiliary: No focal liver abnormality. Status post cholecystectomy. Trace fluid along the gallbladder fossa likely postsurgical. No biliary dilatation. Pancreas: No focal lesion. Normal pancreatic contour. No surrounding  inflammatory changes. No main pancreatic ductal dilatation. Spleen: Normal in size without focal abnormality. Adrenals/Urinary Tract: No adrenal nodule bilaterally. No nephrolithiasis, no hydronephrosis, and no contour-deforming renal mass. No ureterolithiasis or hydroureter. The urinary bladder is unremarkable. Stomach/Bowel: Stomach is within normal limits. No evidence of bowel wall thickening or dilatation. Appendix appears normal. Vascular/Lymphatic: No significant vascular findings are present. No enlarged abdominal or pelvic lymph nodes. Reproductive: Prostate is unremarkable. Other: Trace perihepatic/infra diaphragmatic free fluid and foci of gas. Trace free fluid within the pelvis. No organized fluid collection. Musculoskeletal: No abdominal wall hernia or abnormality. No suspicious lytic or blastic osseous lesions. No acute displaced fracture. IMPRESSION: 1. Bilateral lower lobe consolidations, right greater than left, likely represent atelectasis in the postsurgical setting peer 2. Trace perihepatic/infra-diaphragmatic free fluid and foci of gas likely postsurgical in etiology in the setting of post cholecystectomy. Electronically Signed   By: Tish Frederickson M.D.   On: 12/12/2020 00:42   DG Chest Portable 1 View  Result Date: 12/12/2020 CLINICAL DATA:  Short of breath. Pt had gall bladder removed today here for worsening abd pain and right neck pain. EXAM: PORTABLE CHEST 1 VIEW COMPARISON:  None. FINDINGS: Prominent cardiac silhouette likely due to AP portable technique as well as low lung volumes. The heart size and mediastinal contours are within normal limits. Low lung volumes with bibasilar atelectasis. No pulmonary edema. No pleural effusion. No pneumothorax. No acute osseous abnormality. IMPRESSION: Low  lung volumes with bibasilar atelectasis. Electronically Signed   By: Tish Frederickson M.D.   On: 12/12/2020 00:43    ____________________________________________   PROCEDURES  Procedure(s)  performed (including Critical Care):  Procedures   ____________________________________________   INITIAL IMPRESSION / ASSESSMENT AND PLAN / ED COURSE  Nicholas Gallagher was evaluated in Emergency Department on 12/11/2020 for the symptoms described in the history of present illness. He was evaluated in the context of the global COVID-19 pandemic, which necessitated consideration that the patient might be at risk for infection with the SARS-CoV-2 virus that causes COVID-19. Institutional protocols and algorithms that pertain to the evaluation of patients at risk for COVID-19 are in a state of rapid change based on information released by regulatory bodies including the CDC and federal and state organizations. These policies and algorithms were followed during the patient's care in the ED.    Patient is a 40 year old who comes in with right upper quadrant pain that is severe.  Patient is tachycardic and writhing around in pain.  Will get CT imaging to evaluate for other acute pathology such as perforation, obstruction, ileus.  Low suspicion for ACS but given patient does report a little bit of right shoulder pain that seems more musculoskeletal will get EKG and cardiac markers to evaluate for ACS.  Patient was given 1 dose of IV Dilaudid, IV fluids and IV Zofran  Repeat evaluation patient's CT scan is negative except for postoperative, no evidence of ACS however his LFTs are now elevated.  Discussed with Dr. Lemar Livings from surgery who will admit patient for further work-up    ____________________________________________   FINAL CLINICAL IMPRESSION(S) / ED DIAGNOSES   Final diagnoses:  Elevated LFTs  Right upper quadrant abdominal pain      MEDICATIONS GIVEN DURING THIS VISIT:  Medications  HYDROmorphone (DILAUDID) injection 0.5 mg (has no administration in time range)  HYDROmorphone (DILAUDID) injection 0.5 mg (0.5 mg Intravenous Given 12/12/20 0002)  ondansetron (ZOFRAN) injection 4 mg (4  mg Intravenous Given 12/12/20 0002)  sodium chloride 0.9 % bolus 1,000 mL (1,000 mLs Intravenous New Bag/Given 12/12/20 0003)     ED Discharge Orders    None       Note:  This document was prepared using Dragon voice recognition software and may include unintentional dictation errors.   Concha Se, MD 12/12/20 [phone]

## 2020-12-11 NOTE — Anesthesia Preprocedure Evaluation (Signed)
Anesthesia Evaluation  Patient identified by MRN, date of birth, ID band Patient awake    Reviewed: Allergy & Precautions, NPO status , Patient's Chart, lab work & pertinent test results  History of Anesthesia Complications Negative for: history of anesthetic complications  Airway Mallampati: III  TM Distance: >3 FB Neck ROM: Full    Dental no notable dental hx. (+) Teeth Intact   Pulmonary neg pulmonary ROS, neg sleep apnea, neg COPD, Patient abstained from smoking.Not current smoker,    Pulmonary exam normal breath sounds clear to auscultation       Cardiovascular Exercise Tolerance: Good METS(-) hypertension(-) CAD and (-) Past MI negative cardio ROS  (-) dysrhythmias  Rhythm:Regular Rate:Normal - Systolic murmurs    Neuro/Psych negative neurological ROS  negative psych ROS   GI/Hepatic neg GERD  ,(+)     (-) substance abuse  ,   Endo/Other  neg diabetes  Renal/GU negative Renal ROS     Musculoskeletal   Abdominal   Peds  Hematology   Anesthesia Other Findings Past Medical History: No date: Gallstones  Reproductive/Obstetrics                             Anesthesia Physical Anesthesia Plan  ASA: II  Anesthesia Plan: General   Post-op Pain Management:    Induction: Intravenous  PONV Risk Score and Plan: 3 and Ondansetron, Dexamethasone and Midazolam  Airway Management Planned: Oral ETT  Additional Equipment: None  Intra-op Plan:   Post-operative Plan: Extubation in OR  Informed Consent: I have reviewed the patients History and Physical, chart, labs and discussed the procedure including the risks, benefits and alternatives for the proposed anesthesia with the patient or authorized representative who has indicated his/her understanding and acceptance.     Dental advisory given  Plan Discussed with: CRNA and Surgeon  Anesthesia Plan Comments: (Discussed risks of  anesthesia with patient, including PONV, sore throat, lip/dental damage. Rare risks discussed as well, such as cardiorespiratory and neurological sequelae. Patient understands.)        Anesthesia Quick Evaluation

## 2020-12-11 NOTE — H&P (Signed)
Nicholas Gallagher [id number] 09-10-1980     HPI:  40 y/o male with symptomatic cholelithiaisis.  Minimal symptoms from office visit last week until 2 AM today.  No trigger.     Medications Prior to Admission  Medication Sig Dispense Refill Last Dose  . Multiple Vitamins-Minerals (ADULT GUMMY PO) Take 2 capsules by mouth daily.   12/04/2020  . naproxen sodium (ALEVE) 220 MG tablet Take 220 mg by mouth 2 (two) times daily as needed (pain).   12/05/2020  . ondansetron (ZOFRAN ODT) 4 MG disintegrating tablet Take 1 tablet (4 mg total) by mouth every 6 (six) hours as needed for nausea or vomiting. (Patient not taking: No sig reported) 20 tablet 0 Not Taking at Unknown time  . oxyCODONE-acetaminophen (PERCOCET) 5-325 MG tablet Take 2 tablets by mouth every 6 (six) hours as needed for severe pain. (Patient not taking: No sig reported) 16 tablet 0 Not Taking at Unknown time   No Known Allergies Past Medical History:  Diagnosis Date  . Gallstones    Past Surgical History:  Procedure Laterality Date  . NO PAST SURGERIES     Social History   Socioeconomic History  . Marital status: Married    Spouse name: [name]  . Number of children: 2  . Years of education: Not on file  . Highest education level: Not on file  Occupational History  . Not on file  Tobacco Use  . Smoking status: Never Smoker  . Smokeless tobacco: Never Used  Vaping Use  . Vaping Use: Never used  Substance and Sexual Activity  . Alcohol use: Yes    Comment: occassional  . Drug use: Never  . Sexual activity: Yes  Other Topics Concern  . Not on file  Social History Narrative  . Not on file   Social Determinants of Health   Financial Resource Strain: Not on file  Food Insecurity: Not on file  Transportation Needs: Not on file  Physical Activity: Not on file  Stress: Not on file  Social Connections: Not on file  Intimate Partner Violence: Not on file   Social History   Social History Narrative  . Not on file      ROS: Negative.     PE: HEENT: Negative. Lungs: Clear. Cardio: RR. ABD: Less pronounced fullness in RUQ.   Assessment/Plan:  Proceed with planned cholecystectomy.   Merrily Pew [facility] 12/11/2020

## 2020-12-11 NOTE — Anesthesia Procedure Notes (Signed)
Procedure Name: Intubation Date/Time: 12/11/2020 1:48 PM Performed by: Danelle Berry, CRNA Pre-anesthesia Checklist: Patient identified, Emergency Drugs available, Suction available and Patient being monitored Patient Re-evaluated:Patient Re-evaluated prior to induction Oxygen Delivery Method: Circle system utilized Preoxygenation: Pre-oxygenation with 100% oxygen Induction Type: IV induction Ventilation: Mask ventilation without difficulty Laryngoscope Size: McGraph and 3 Tube type: Oral Tube size: 7.5 mm Number of attempts: 1 Airway Equipment and Method: Stylet and Oral airway Placement Confirmation: ETT inserted through vocal cords under direct vision,  positive ETCO2 and breath sounds checked- equal and bilateral Secured at: 22 cm Tube secured with: Tape Dental Injury: Teeth and Oropharynx as per pre-operative assessment

## 2020-12-11 NOTE — Op Note (Signed)
Preoperative diagnosis: Acute on chronic cholecystitis and cholelithiasis.  Postoperative diagnosis: Same.  Operative procedure: Laparoscopic cholecystectomy.  Operating surgeon: Donnalee Curry, MD.  Anesthesia: General endotracheal.  Estimated blood loss: 10 cc.  Clinical note: This 40 year old male has had episodic right upper quadrant and epigastric pain for the last 8 to 10 months.  Just under 2 weeks ago he had a severe episode prompting him to present to the emergency room.  Ultrasound showed evidence of cholelithiasis without gallbladder wall thickening or pericholecystic fluid.  He was noted to have a moderate leukocytosis.  He was seen in the office last week and had had persistent tenderness suggesting the need for early intervention.  He received Ancef prior to the procedure.  SCD stockings for DVT prevention.  Operative note: With the patient under adequate general endotracheal anesthesia the abdomen was clipped of excessive hair and then cleansed with ChloraPrep and draped.  In Trendelenburg position a varies needle was placed with transmittal incision.  There had been palpated a 5 mm fascial defect at this area.  The hanging drop test was normal and the abdomen was insufflated with CO2 at 10 mmHg pressure.  A 10 mm Neck step port was expanded followed by placement of an 11 mm XL port in the epigastrium and 2-5 mm Neck step ports in the right lateral abdominal wall.  The patient was placed in reverse Trendelenburg position and rolled to the left.  The omentum was draped into the right upper quadrant adherent to the gallbladder.  This was taken down with blunt and cautery dissection to expose the fundus of the gallbladder.  The gallbladder was decompressed which allowed graspers to be placed.  The omentum was swept inferiorly and medially to provide better exposure.  The anesthetist placed an orogastric tube due to moderate gastric distention.  Due to the limited working space 1/5 port was  placed in the right anterior axillary line and a Peer retractor placed to sweep the omentum and duodenum inferiorly.  This provide good exposure of the neck of the gallbladder.  A fairly tedious dissection proceeded to separate the duodenum from the neck of the gallbladder.  The cystic duct was noted to be thickened.  This was cleared with a mixture clamp the cystic artery was identified and clipped.  At this point all ductal structures appear to be inferior and medial to the plane of dissection, critical view of safety.  The cystic duct was doubly clipped and divided as was the cystic artery.  The gallbladder was then rotated cephalad and cautery dissection was used.  The gallbladder wall was entered and the stones lost and retrieved.  There was a mucoid material in the gallbladder which was cultured at the end of the procedure.  The gallbladder was firmly adherent to the liver bed and bleeding points were controlled with electrocautery.  The gallbladder was placed in an Endo Catch bag and then delivered through the expanded umbilical port site.  After reestablishing pneumoperitoneum, and inspection from the epigastric site showing no evidence of injury from initial port placement, the camera was moved back to the umbilical port.  The right upper quadrant was irrigated with normal saline.  Good hemostasis was noted.  The abdomen was then desufflated and ports removed under direct vision.  The fascia at the umbilicus was closed with a single 0 PDS figure-of-eight suture.  The skin incisions were then closed with a 4-0 Vicryl subcuticular suture.  Benzoin, Steri-Strips, Telfa and Tegaderm dressings were applied.  The  patient tolerated the procedure well and was taken recovery in stable condition.

## 2020-12-11 NOTE — Anesthesia Postprocedure Evaluation (Signed)
Anesthesia Post Note  Patient: Nicholas Gallagher  Procedure(s) Performed: LAPAROSCOPIC CHOLECYSTECTOMY WITH INTRAOPERATIVE CHOLANGIOGRAM (N/A Abdomen)  Patient location during evaluation: PACU Anesthesia Type: General Level of consciousness: awake and alert Pain management: pain level controlled Vital Signs Assessment: post-procedure vital signs reviewed and stable Respiratory status: spontaneous breathing, nonlabored ventilation, respiratory function stable and patient connected to nasal cannula oxygen Cardiovascular status: blood pressure returned to baseline and stable Postop Assessment: no apparent nausea or vomiting Anesthetic complications: no   No complications documented.   Last Vitals:  Vitals:   12/11/20 1540 12/11/20 1610  BP: (!) 152/96 132/78  Pulse: 81   Resp:    Temp: 36.5 C   SpO2: 100%     Last Pain:  Vitals:   12/11/20 1610  TempSrc:   PainSc: 4                  Corinda Gubler

## 2020-12-12 ENCOUNTER — Other Ambulatory Visit: Payer: BC Managed Care – PPO

## 2020-12-12 ENCOUNTER — Inpatient Hospital Stay: Payer: BC Managed Care – PPO

## 2020-12-12 ENCOUNTER — Encounter
Admission: EM | Disposition: A | Discharge status: Home / Self Care | Payer: Self-pay | Source: Home / Self Care | Attending: General Surgery

## 2020-12-12 ENCOUNTER — Observation Stay: Payer: BC Managed Care – PPO

## 2020-12-12 ENCOUNTER — Emergency Department: Payer: BC Managed Care – PPO

## 2020-12-12 ENCOUNTER — Observation Stay: Payer: BC Managed Care – PPO | Admitting: Anesthesiology

## 2020-12-12 ENCOUNTER — Encounter: Payer: Self-pay | Admitting: General Surgery

## 2020-12-12 DIAGNOSIS — Y838 Other surgical procedures as the cause of abnormal reaction of the patient, or of later complication, without mention of misadventure at the time of the procedure: Secondary | ICD-10-CM | POA: Diagnosis present

## 2020-12-12 DIAGNOSIS — Z20822 Contact with and (suspected) exposure to covid-19: Secondary | ICD-10-CM | POA: Diagnosis present

## 2020-12-12 DIAGNOSIS — G8918 Other acute postprocedural pain: Secondary | ICD-10-CM | POA: Diagnosis present

## 2020-12-12 DIAGNOSIS — K851 Biliary acute pancreatitis without necrosis or infection: Secondary | ICD-10-CM | POA: Diagnosis not present

## 2020-12-12 DIAGNOSIS — J90 Pleural effusion, not elsewhere classified: Secondary | ICD-10-CM | POA: Diagnosis not present

## 2020-12-12 DIAGNOSIS — R1011 Right upper quadrant pain: Secondary | ICD-10-CM | POA: Diagnosis present

## 2020-12-12 DIAGNOSIS — K8309 Other cholangitis: Secondary | ICD-10-CM | POA: Diagnosis not present

## 2020-12-12 DIAGNOSIS — K5903 Drug induced constipation: Secondary | ICD-10-CM | POA: Diagnosis not present

## 2020-12-12 DIAGNOSIS — K9189 Other postprocedural complications and disorders of digestive system: Secondary | ICD-10-CM | POA: Diagnosis not present

## 2020-12-12 DIAGNOSIS — R509 Fever, unspecified: Secondary | ICD-10-CM | POA: Diagnosis not present

## 2020-12-12 DIAGNOSIS — Y733 Surgical instruments, materials and gastroenterology and urology devices (including sutures) associated with adverse incidents: Secondary | ICD-10-CM | POA: Diagnosis present

## 2020-12-12 DIAGNOSIS — K858 Other acute pancreatitis without necrosis or infection: Secondary | ICD-10-CM | POA: Diagnosis not present

## 2020-12-12 DIAGNOSIS — K838 Other specified diseases of biliary tract: Secondary | ICD-10-CM

## 2020-12-12 DIAGNOSIS — K8012 Calculus of gallbladder with acute and chronic cholecystitis without obstruction: Secondary | ICD-10-CM | POA: Diagnosis present

## 2020-12-12 DIAGNOSIS — J9811 Atelectasis: Secondary | ICD-10-CM | POA: Diagnosis not present

## 2020-12-12 DIAGNOSIS — E871 Hypo-osmolality and hyponatremia: Secondary | ICD-10-CM | POA: Diagnosis not present

## 2020-12-12 DIAGNOSIS — Z79899 Other long term (current) drug therapy: Secondary | ICD-10-CM | POA: Diagnosis not present

## 2020-12-12 DIAGNOSIS — T402X5A Adverse effect of other opioids, initial encounter: Secondary | ICD-10-CM | POA: Diagnosis not present

## 2020-12-12 DIAGNOSIS — K859 Acute pancreatitis without necrosis or infection, unspecified: Secondary | ICD-10-CM | POA: Diagnosis not present

## 2020-12-12 DIAGNOSIS — K812 Acute cholecystitis with chronic cholecystitis: Secondary | ICD-10-CM | POA: Diagnosis not present

## 2020-12-12 DIAGNOSIS — R0902 Hypoxemia: Secondary | ICD-10-CM | POA: Diagnosis not present

## 2020-12-12 HISTORY — PX: ERCP: SHX5425

## 2020-12-12 LAB — HEPATIC FUNCTION PANEL
ALT: 1184 U/L — ABNORMAL HIGH (ref 0–44)
ALT: 1019 U/L — ABNORMAL HIGH (ref 0–44)
AST: 783 U/L — ABNORMAL HIGH (ref 15–41)
AST: 389 U/L — ABNORMAL HIGH (ref 15–41)
Albumin: 3.9 g/dL (ref 3.5–5.0)
Albumin: 4 g/dL (ref 3.5–5.0)
Alkaline Phosphatase: 149 U/L — ABNORMAL HIGH (ref 38–126)
Alkaline Phosphatase: 177 U/L — ABNORMAL HIGH (ref 38–126)
Bilirubin, Direct: 1.5 mg/dL — ABNORMAL HIGH (ref 0.0–0.2)
Bilirubin, Direct: 1.2 mg/dL — ABNORMAL HIGH (ref 0.0–0.2)
Indirect Bilirubin: 1.1 mg/dL — ABNORMAL HIGH (ref 0.3–0.9)
Indirect Bilirubin: 1.3 mg/dL — ABNORMAL HIGH (ref 0.3–0.9)
Total Bilirubin: 2.6 mg/dL — ABNORMAL HIGH (ref 0.3–1.2)
Total Bilirubin: 2.5 mg/dL — ABNORMAL HIGH (ref 0.3–1.2)
Total Protein: 7.2 g/dL (ref 6.5–8.1)
Total Protein: 7.3 g/dL (ref 6.5–8.1)

## 2020-12-12 LAB — CBC WITH DIFFERENTIAL/PLATELET
Abs Immature Granulocytes: 0.08 10*3/uL — ABNORMAL HIGH (ref 0.00–0.07)
Basophils Absolute: 0 10*3/uL (ref 0.0–0.1)
Basophils Relative: 0 %
Eosinophils Absolute: 0 10*3/uL (ref 0.0–0.5)
Eosinophils Relative: 0 %
HCT: 44.1 % (ref 39.0–52.0)
Hemoglobin: 16 g/dL (ref 13.0–17.0)
Immature Granulocytes: 1 %
Lymphocytes Relative: 7 %
Lymphs Abs: 0.9 10*3/uL (ref 0.7–4.0)
MCH: 31.1 pg (ref 26.0–34.0)
MCHC: 36.3 g/dL — ABNORMAL HIGH (ref 30.0–36.0)
MCV: 85.8 fL (ref 80.0–100.0)
Monocytes Absolute: 0.9 10*3/uL (ref 0.1–1.0)
Monocytes Relative: 8 %
Neutro Abs: 10.4 10*3/uL — ABNORMAL HIGH (ref 1.7–7.7)
Neutrophils Relative %: 84 %
Platelets: 662 10*3/uL — ABNORMAL HIGH (ref 150–400)
RBC: 5.14 MIL/uL (ref 4.22–5.81)
RDW: 12.2 % (ref 11.5–15.5)
WBC: 12.3 10*3/uL — ABNORMAL HIGH (ref 4.0–10.5)
nRBC: 0 % (ref 0.0–0.2)

## 2020-12-12 LAB — URINALYSIS, COMPLETE (UACMP) WITH MICROSCOPIC
Bacteria, UA: NONE SEEN
Bilirubin Urine: NEGATIVE
Glucose, UA: 50 mg/dL — AB
Hgb urine dipstick: NEGATIVE
Ketones, ur: 5 mg/dL — AB
Leukocytes,Ua: NEGATIVE
Nitrite: NEGATIVE
Protein, ur: NEGATIVE mg/dL
Specific Gravity, Urine: 1.025 (ref 1.005–1.030)
Squamous Epithelial / HPF: NONE SEEN (ref 0–5)
pH: 5 (ref 5.0–8.0)

## 2020-12-12 LAB — COMPREHENSIVE METABOLIC PANEL
ALT: 1214 U/L — ABNORMAL HIGH (ref 0–44)
AST: 919 U/L — ABNORMAL HIGH (ref 15–41)
Albumin: 4.4 g/dL (ref 3.5–5.0)
Alkaline Phosphatase: 165 U/L — ABNORMAL HIGH (ref 38–126)
Anion gap: 17 — ABNORMAL HIGH (ref 5–15)
BUN: 12 mg/dL (ref 6–20)
CO2: 19 mmol/L — ABNORMAL LOW (ref 22–32)
Calcium: 9.6 mg/dL (ref 8.9–10.3)
Chloride: 100 mmol/L (ref 98–111)
Creatinine, Ser: 1.06 mg/dL (ref 0.61–1.24)
GFR, Estimated: >60 mL/min (ref >60)
Glucose, Bld: 217 mg/dL — ABNORMAL HIGH (ref 70–99)
Potassium: 4 mmol/L (ref 3.5–5.1)
Sodium: 136 mmol/L (ref 135–145)
Total Bilirubin: 3.1 mg/dL — ABNORMAL HIGH (ref 0.3–1.2)
Total Protein: 8.1 g/dL (ref 6.5–8.1)

## 2020-12-12 LAB — TROPONIN I (HIGH SENSITIVITY)
Troponin I (High Sensitivity): 3 ng/L (ref <18)
Troponin I (High Sensitivity): 3 ng/L (ref <18)

## 2020-12-12 LAB — RESP PANEL BY RT-PCR (FLU A&B, COVID) ARPGX2
Influenza A by PCR: NEGATIVE
Influenza B by PCR: NEGATIVE
SARS Coronavirus 2 by RT PCR: NEGATIVE

## 2020-12-12 LAB — LIPASE, BLOOD: Lipase: 31 U/L (ref 11–51)

## 2020-12-12 IMAGING — NM NM HEPATOBILIARY SCAN
3 series · 13 of 13 positions shown · non-contrast
Comparison: None.

CLINICAL DATA: Status post cholecystectomy. Perihepatic fluid on
recent MRI. Evaluate for postop bile leak.

EXAM:
NUCLEAR MEDICINE HEPATOBILIARY IMAGING
TECHNIQUE: Sequential images of the abdomen were obtained [DATE] minutes
following intravenous administration of radiopharmaceutical.
RADIOPHARMACEUTICALS:  7.4 mCi [WJ]  Choletec IV

[Series 1000: gallbladder delays · 3.30mm/px · 1 of 1 slices shown]
[im 1/1]
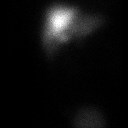

[Series 1000: hepatobiliary scan · 9.59mm/px · 6 of 60 frames shown]
[frame 6/60]
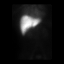
[frame 16/60]
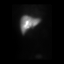
[frame 26/60]
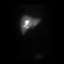
[frame 36/60]
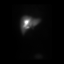
[frame 46/60]
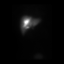
[frame 56/60]
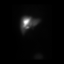

[Series 1000: hepatobiliary scan hr 2 · 9.59mm/px · 6 of 60 frames shown]
[frame 6/60]
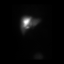
[frame 16/60]
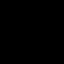
[frame 26/60]
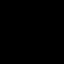
[frame 36/60]
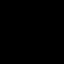
[frame 46/60]
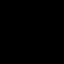
[frame 56/60]
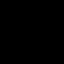

[13 of 13 positions shown; findings below may reference images not displayed]

FINDINGS: Prompt uptake and biliary excretion of activity by the liver is
seen. Patient is status post cholecystectomy. Accumulation of
biliary activity is seen in the gallbladder fossa, which then shows
progressive extension into the perihepatic spaces, consistent with
postop bile leak.

No biliary activity reaches the small bowel, however patency of the
common bile duct cannot be determined due to the large bile leak.
IMPRESSION: Large post-op bile leak.

No biliary activity reaches small bowel, however common bile duct
patency cannot be determined due to the large bowel loop.

These results will be called to the ordering clinician or
representative by the Radiologist Assistant, and communication
documented in the PACS or [REDACTED].

## 2020-12-12 SURGERY — ENDOSCOPIC RETROGRADE CHOLANGIOPANCREATOGRAPHY (ERCP) WITH PROPOFOL
Anesthesia: General

## 2020-12-12 SURGERY — ERCP, WITH INTERVENTION IF INDICATED
Anesthesia: General

## 2020-12-12 MED ORDER — ONDANSETRON HCL 4 MG/2ML IJ SOLN
4.0000 mg | INTRAMUSCULAR | Status: DC | PRN
  Administered 2020-12-14: 4 mg via INTRAVENOUS
  Filled 2020-12-12: qty 2

## 2020-12-12 MED ORDER — HYDROMORPHONE HCL 1 MG/ML IJ SOLN
1.0000 mg | INTRAMUSCULAR | Status: DC | PRN
Start: 2020-12-12 — End: 2020-12-17
  Administered 2020-12-12 – 2020-12-17 (×32): 1 mg via INTRAVENOUS
  Filled 2020-12-12 (×34): qty 1

## 2020-12-12 MED ORDER — GADOBUTROL 1 MMOL/ML IV SOLN
10.0000 mL | Freq: Once | INTRAVENOUS | Status: AC | PRN
  Administered 2020-12-12: 10 mL via INTRAVENOUS

## 2020-12-12 MED ORDER — FENTANYL CITRATE (PF) 100 MCG/2ML IJ SOLN
INTRAMUSCULAR | Status: DC | PRN
  Administered 2020-12-12: 50 ug via INTRAVENOUS

## 2020-12-12 MED ORDER — OXYCODONE HCL 5 MG PO TABS
10.0000 mg | ORAL_TABLET | ORAL | Status: DC | PRN
  Administered 2020-12-13 – 2020-12-17 (×10): 10 mg via ORAL
  Filled 2020-12-12 (×10): qty 2

## 2020-12-12 MED ORDER — DEXMEDETOMIDINE (PRECEDEX) IN NS 20 MCG/5ML (4 MCG/ML) IV SYRINGE
PREFILLED_SYRINGE | INTRAVENOUS | Status: AC
  Filled 2020-12-12: qty 5

## 2020-12-12 MED ORDER — INDOMETHACIN 50 MG RE SUPP
100.0000 mg | Freq: Once | RECTAL | Status: AC
  Filled 2020-12-12: qty 2

## 2020-12-12 MED ORDER — PROPOFOL 10 MG/ML IV BOLUS
INTRAVENOUS | Status: DC | PRN
  Administered 2020-12-12: 200 mg via INTRAVENOUS

## 2020-12-12 MED ORDER — DEXMEDETOMIDINE (PRECEDEX) IN NS 20 MCG/5ML (4 MCG/ML) IV SYRINGE
PREFILLED_SYRINGE | INTRAVENOUS | Status: DC | PRN
  Administered 2020-12-12: 12 ug via INTRAVENOUS

## 2020-12-12 MED ORDER — DEXAMETHASONE SODIUM PHOSPHATE 10 MG/ML IJ SOLN
INTRAMUSCULAR | Status: DC | PRN
  Administered 2020-12-12: 10 mg via INTRAVENOUS

## 2020-12-12 MED ORDER — ONDANSETRON HCL 4 MG/2ML IJ SOLN
INTRAMUSCULAR | Status: DC | PRN
  Administered 2020-12-12: 4 mg via INTRAVENOUS

## 2020-12-12 MED ORDER — HYDROMORPHONE HCL 1 MG/ML IJ SOLN
0.5000 mg | Freq: Once | INTRAMUSCULAR | Status: AC
  Administered 2020-12-12: 0.5 mg via INTRAVENOUS
  Filled 2020-12-12: qty 1

## 2020-12-12 MED ORDER — LACTATED RINGERS IV SOLN: INTRAVENOUS | Status: DC

## 2020-12-12 MED ORDER — ACETAMINOPHEN 10 MG/ML IV SOLN: 1000.0000 mg | Freq: Four times a day (QID) | INTRAVENOUS | Status: DC

## 2020-12-12 MED ORDER — FENTANYL CITRATE (PF) 100 MCG/2ML IJ SOLN
INTRAMUSCULAR | Status: AC
  Filled 2020-12-12: qty 2

## 2020-12-12 MED ORDER — MORPHINE SULFATE (PF) 4 MG/ML IV SOLN
4.0000 mg | Freq: Once | INTRAVENOUS | Status: AC
  Administered 2020-12-12: 4 mg via INTRAVENOUS
  Filled 2020-12-12: qty 1

## 2020-12-12 MED ORDER — SUCCINYLCHOLINE CHLORIDE 20 MG/ML IJ SOLN
INTRAMUSCULAR | Status: DC | PRN
  Administered 2020-12-12: 120 mg via INTRAVENOUS

## 2020-12-12 MED ORDER — MIDAZOLAM HCL 2 MG/2ML IJ SOLN
INTRAMUSCULAR | Status: AC
  Filled 2020-12-12: qty 2

## 2020-12-12 MED ORDER — TECHNETIUM TC 99M MEBROFENIN IV KIT
5.0000 | PACK | Freq: Once | INTRAVENOUS | Status: AC | PRN
  Administered 2020-12-12: 7.37 via INTRAVENOUS

## 2020-12-12 MED ORDER — MIDAZOLAM HCL 2 MG/2ML IJ SOLN
INTRAMUSCULAR | Status: DC | PRN
  Administered 2020-12-12: 2 mg via INTRAVENOUS

## 2020-12-12 MED ORDER — INDOMETHACIN 50 MG RE SUPP
RECTAL | Status: AC
  Administered 2020-12-12: 100 mg via RECTAL
  Filled 2020-12-12: qty 2

## 2020-12-12 MED ORDER — CIPROFLOXACIN IN D5W 400 MG/200ML IV SOLN
400.0000 mg | Freq: Two times a day (BID) | INTRAVENOUS | Status: DC
  Administered 2020-12-12 – 2020-12-15 (×7): 400 mg via INTRAVENOUS
  Filled 2020-12-12 (×8): qty 200

## 2020-12-12 MED ORDER — LACTATED RINGERS IV SOLN: INTRAVENOUS | Status: DC | PRN

## 2020-12-12 MED ORDER — ENOXAPARIN SODIUM 40 MG/0.4ML IJ SOSY
40.0000 mg | PREFILLED_SYRINGE | INTRAMUSCULAR | Status: DC
  Administered 2020-12-15 – 2020-12-16 (×2): 40 mg via SUBCUTANEOUS
  Filled 2020-12-12 (×3): qty 0.4

## 2020-12-12 MED ORDER — HYDROMORPHONE HCL 1 MG/ML IJ SOLN
1.0000 mg | Freq: Once | INTRAMUSCULAR | Status: AC
  Administered 2020-12-12: 1 mg via INTRAVENOUS
  Filled 2020-12-12: qty 1

## 2020-12-12 MED ORDER — ACETAMINOPHEN 500 MG PO TABS
1000.0000 mg | ORAL_TABLET | Freq: Four times a day (QID) | ORAL | Status: AC
  Administered 2020-12-12: 1000 mg via ORAL
  Filled 2020-12-12 (×2): qty 2

## 2020-12-12 MED ORDER — LIDOCAINE HCL (CARDIAC) PF 100 MG/5ML IV SOSY
PREFILLED_SYRINGE | INTRAVENOUS | Status: DC | PRN
  Administered 2020-12-12: 60 mg via INTRAVENOUS

## 2020-12-12 NOTE — Progress Notes (Signed)
Still with pain, most in RUQ. Reviewed case w/ GI. Recommended MRCP. Will cancel HIDA and proceed.

## 2020-12-12 NOTE — H&P (Signed)
Nicholas Gallagher [id number] 1980/10/06     HPI: This 40 year old male underwent a laparoscopic cholecystectomy yesterday.  He had had a 48-month history of intermittent abdominal pain and a 2-week history of severe pain resulting in presentation to the ED.  Between his the time of his in office exam and yesterday morning he had been asymptomatic until 2 AM yesterday when he had a recurrence of pain, not as severe as that which prompted his presentation to the ED earlier but more than he had experienced since that time.  At the time of surgery he had evidence of severe acute on chronic cholecystitis and cholelithiasis.  Inflammation precluded cholangiograms.  Critical view of safety was maintained during the procedure and no bile leakage had been noted.  At the time of discharge, 5 PM yesterday, he appeared well but last night had crescendo pain resulting in presentation this morning.  Laboratory studies are notable for marked elevation of the serum transaminases, less so the alkaline phosphatase and modest elevation of the bilirubin.  He is admitted for pain control, IV fluids and further imaging to determine if he has a common duct stone versus biliary ductal injury.  (Not in a hospital admission)  No Known Allergies Past Medical History:  Diagnosis Date  . Gallstones    Past Surgical History:  Procedure Laterality Date  . NO PAST SURGERIES     Social History   Socioeconomic History  . Marital status: Married    Spouse name: [name]  . Number of children: 2  . Years of education: Not on file  . Highest education level: Not on file  Occupational History  . Not on file  Tobacco Use  . Smoking status: Never Smoker  . Smokeless tobacco: Never Used  Vaping Use  . Vaping Use: Never used  Substance and Sexual Activity  . Alcohol use: Yes    Comment: occassional  . Drug use: Never  . Sexual activity: Yes  Other Topics Concern  . Not on file  Social History Narrative  . Not on file    Social Determinants of Health   Financial Resource Strain: Not on file  Food Insecurity: Not on file  Transportation Needs: Not on file  Physical Activity: Not on file  Stress: Not on file  Social Connections: Not on file  Intimate Partner Violence: Not on file   Social History   Social History Narrative  . Not on file     ROS: Negative.     PE: HEENT: Negative.  Sclera clear. Lungs: Clear. Cardio: RR. Abdomen: Minimal distention, bowel sounds present.  Mild diffuse tenderness consistent with postoperative state.    Assessment/Plan:  Admission, IV fluids, IV Cipro, HIDA scan in a.m. [facility] 12/12/2020

## 2020-12-12 NOTE — Transfer of Care (Signed)
Immediate Anesthesia Transfer of Care Note  Patient: Nicholas Gallagher  Procedure(s) Performed: ENDOSCOPIC RETROGRADE CHOLANGIOPANCREATOGRAPHY (ERCP) (N/A )  Patient Location: PACU  Anesthesia Type:General  Level of Consciousness: drowsy  Airway & Oxygen Therapy: Patient Spontanous Breathing and Patient connected to nasal cannula oxygen  Post-op Assessment: Report given to RN and Post -op Vital signs reviewed and stable  Post vital signs: Reviewed and stable  Last Vitals:  Vitals Value Taken Time  BP 127/87 12/12/20 1707  Temp 36.5 C 12/12/20 1707  Pulse 82 12/12/20 1713  Resp 14 12/12/20 1713  SpO2 98 % 12/12/20 1713  Vitals shown include unvalidated device data.  Last Pain:  Vitals:   12/12/20 1707  TempSrc:   PainSc: Asleep         Complications: No complications documented.

## 2020-12-12 NOTE — Anesthesia Preprocedure Evaluation (Signed)
Anesthesia Evaluation  Patient identified by MRN, date of birth, ID band Patient awake    Reviewed: Allergy & Precautions, NPO status , Patient's Chart, lab work & pertinent test results  History of Anesthesia Complications Negative for: history of anesthetic complications  Airway Mallampati: III  TM Distance: >3 FB Neck ROM: Full    Dental no notable dental hx. (+) Teeth Intact   Pulmonary neg pulmonary ROS, neg sleep apnea, neg COPD, Patient abstained from smoking.Not current smoker,    Pulmonary exam normal breath sounds clear to auscultation       Cardiovascular Exercise Tolerance: Good METS(-) hypertension(-) CAD and (-) Past MI negative cardio ROS  (-) dysrhythmias  Rhythm:Regular Rate:Normal - Systolic murmurs    Neuro/Psych negative neurological ROS  negative psych ROS   GI/Hepatic neg GERD  ,(+)     (-) substance abuse  ,   Endo/Other  negative endocrine ROSneg diabetes  Renal/GU negative Renal ROS     Musculoskeletal negative musculoskeletal ROS (+)   Abdominal   Peds  Hematology negative hematology ROS (+)   Anesthesia Other Findings Past Medical History: No date: Gallstones  Reproductive/Obstetrics                             Anesthesia Physical  Anesthesia Plan  ASA: II  Anesthesia Plan: General   Post-op Pain Management:    Induction: Intravenous  PONV Risk Score and Plan: 3 and Ondansetron, Dexamethasone and Midazolam  Airway Management Planned: Oral ETT  Additional Equipment: None  Intra-op Plan:   Post-operative Plan: Extubation in OR  Informed Consent: I have reviewed the patients History and Physical, chart, labs and discussed the procedure including the risks, benefits and alternatives for the proposed anesthesia with the patient or authorized representative who has indicated his/her understanding and acceptance.     Dental advisory  given  Plan Discussed with: CRNA and Surgeon  Anesthesia Plan Comments: (Discussed risks of anesthesia with patient, including PONV, sore throat, lip/dental damage. Rare risks discussed as well, such as cardiorespiratory and neurological sequelae. Patient understands.)        Anesthesia Quick Evaluation

## 2020-12-12 NOTE — Op Note (Signed)
[facility] Gastroenterology Patient Name: Nicholas Gallagher Procedure Date: 12/12/2020 4:01 PM MRN: [id number] Account #: [id number] Date of Birth: 10-23-1980 Admit Type: Inpatient Age: 40 Room: [facility] ENDO ROOM 4 Gender: Male Note Status: Finalized Procedure:             ERCP Indications:           Bile leak Providers:             Midge Minium MD, MD Referring MD:          Marina Goodell (Referring MD) Medicines:             General Anesthesia Complications:         No immediate complications. Procedure:             Pre-Anesthesia Assessment:                        - Prior to the procedure, a History and Physical was                         performed, and patient medications and allergies were                         reviewed. The patient's tolerance of previous                         anesthesia was also reviewed. The risks and benefits                         of the procedure and the sedation options and risks                         were discussed with the patient. All questions were                         answered, and informed consent was obtained. Prior                         Anticoagulants: The patient has taken no previous                         anticoagulant or antiplatelet agents. ASA Grade                         Assessment: II - A patient with mild systemic disease.                         After reviewing the risks and benefits, the patient                         was deemed in satisfactory condition to undergo the                         procedure.                        After obtaining informed consent, the scope was passed  under direct vision. Throughout the procedure, the                         patient's blood pressure, pulse, and oxygen                         saturations were monitored continuously. The [employer] D single use duodenoscope was                         introduced  through the mouth, and used to inject                         contrast into and used to inject contrast into the                         dorsal pancreatic duct. The ERCP was accomplished                         without difficulty. The patient tolerated the                         procedure well. Findings:      A scout film of the abdomen was obtained. Surgical clips, consistent       with a previous cholecystectomy, were seen in the area of the right       upper quadrant of the abdomen. The esophagus was successfully intubated       under direct vision. The scope was advanced to a normal major papilla in       the descending duodenum without detailed examination of the pharynx,       larynx and associated structures, and upper GI tract. The upper GI tract       was grossly normal. The bile duct was deeply cannulated with the       short-nosed traction sphincterotome. Contrast was injected. I personally       interpreted the bile duct images. There was brisk flow of contrast       through the ducts. Image quality was excellent. Contrast extended to the       entire biliary tree. Extravasation of contrast originating from the       cystic duct was observed. A wire was passed into the biliary tree.       Biliary sphincterotomy was made with a traction (standard)       sphincterotome using ERBE electrocautery. There was no       post-sphincterotomy bleeding. One 10 Fr by 7 cm plastic stent with a       single external flap and a single internal flap was placed 5 cm into the       common bile duct. Bile flowed through the stent. The stent was in good       position. Impression:            - A bile leak was found.                        - A biliary sphincterotomy was performed.                        -  One plastic stent was placed into the common bile                         duct. Recommendation:        - Return patient to hospital ward for ongoing care.                        - Clear liquid  diet.                        - Watch for pancreatitis, bleeding, perforation, and                         cholangitis.                        - Repeat ERCP in 3 months to remove stent. Procedure Code(s):     --- Professional ---                        [phone], Endoscopic retrograde cholangiopancreatography                         (ERCP); with placement of endoscopic stent into                         biliary or pancreatic duct, including pre- and                         post-dilation and guide wire passage, when performed,                         including sphincterotomy, when performed, each stent                        [id number], Endoscopic catheterization of the biliary                         ductal system, radiological supervision and                         interpretation Diagnosis Code(s):     --- Professional ---                        K83.9, Disease of biliary tract, unspecified CPT copyright 2019 American Medical Association. All rights reserved. The codes documented in this report are preliminary and upon coder review may  be revised to meet current compliance requirements. Midge Minium MD, MD 12/12/2020 4:59:31 PM This report has been signed electronically. Number of Addenda: 0 Note Initiated On: 12/12/2020 4:01 PM Estimated Blood Loss:  Estimated blood loss: none.      [facility]

## 2020-12-12 NOTE — ED Notes (Signed)
Katie RN aware of assigned bed °

## 2020-12-12 NOTE — Anesthesia Procedure Notes (Signed)
Procedure Name: Intubation Date/Time: 12/12/2020 4:30 PM Performed by: Doreen Salvage, CRNA Pre-anesthesia Checklist: Patient identified, Patient being monitored, Timeout performed, Emergency Drugs available and Suction available Patient Re-evaluated:Patient Re-evaluated prior to induction Oxygen Delivery Method: Circle system utilized Preoxygenation: Pre-oxygenation with 100% oxygen Induction Type: IV induction Ventilation: Mask ventilation without difficulty Laryngoscope Size: Mac, 4 and McGraph Grade View: Grade I Tube type: Oral Tube size: 7.5 mm Number of attempts: 1 Airway Equipment and Method: Stylet Placement Confirmation: ETT inserted through vocal cords under direct vision,  positive ETCO2 and breath sounds checked- equal and bilateral Secured at: 23 cm Tube secured with: Tape Dental Injury: Teeth and Oropharynx as per pre-operative assessment

## 2020-12-12 NOTE — Consult Note (Signed)
Midge Minium, MD [facility]  [address], [address] [place], [place] [address] Phone: [phone] Fax : [phone]  Consultation  Referring Provider:     Dr. Birdie Sons Primary Care Physician:  Marina Goodell, MD Primary Gastroenterologist:     Gentry Fitz      Reason for Consultation:     Abnormal liver enzyme  Date of Admission:  12/11/2020 Date of Consultation:  12/12/2020         HPI:   Nicholas Gallagher is a 40 y.o. male who was admitted today after having a laparoscopic cholecystectomy yesterday.  The patient presented to the ER around midnight yesterday for abdominal pain.  The patient had his cholecystectomy due to intermittent abdominal pain for 8 months with 2-week history of severe pain.  The patient was discharged around 5:00 yesterday and was reported by his wife to have had consistent abdominal pain that never got better.  At that time the patient took pain medication including 1 pill of medication with 325 mg of Tylenol and it and then repeated the same dosage later without any help for the second time therefore they went to the ER.  Was presented to me by Dr. Lemar Livings this morning and I recommended an MRCP to make sure that the duct had no retained stones and that there was no transection of the duct with the patient's elevated liver enzymes and increased bilirubin.  The patient's liver enzymes had shown significant elevation with his AST of 919 that went down to 783 today.  The patient's ALT was 1214 with it going down to 1184 today.  The alkaline phosphatase has been relatively low for those liver enzymes with it being 165 yesterday and 149 today.  His total bilirubin on admission yesterday was 11.1 which came down to 2.6 today.  Past Medical History:  Diagnosis Date  . Gallstones     Past Surgical History:  Procedure Laterality Date  . CHOLECYSTECTOMY N/A 12/11/2020   Procedure: LAPAROSCOPIC CHOLECYSTECTOMY WITH INTRAOPERATIVE CHOLANGIOGRAM;  Surgeon: Earline Mayotte, MD;   Location: [facility] ORS;  Service: General;  Laterality: N/A;  . NO PAST SURGERIES      Prior to Admission medications   Medication Sig Start Date End Date Taking? Authorizing Provider  ciprofloxacin (CIPRO) 500 MG tablet Take 1 tablet (500 mg total) by mouth 2 (two) times daily for 7 days. 12/11/20 12/18/20  Earline Mayotte, MD  Multiple Vitamins-Minerals (ADULT GUMMY PO) Take 2 capsules by mouth daily.    [provider]  naproxen sodium (ALEVE) 220 MG tablet Take 220 mg by mouth 2 (two) times daily as needed (pain).    [provider]  ondansetron (ZOFRAN ODT) 4 MG disintegrating tablet Take 1 tablet (4 mg total) by mouth every 6 (six) hours as needed for nausea or vomiting. Patient not taking: No sig reported 12/01/20   Ward, Layla Maw, DO  oxyCODONE-acetaminophen (PERCOCET) 5-325 MG tablet Take 2 tablets by mouth every 6 (six) hours as needed for severe pain. Patient not taking: No sig reported 12/01/20 12/01/21  Ward, Layla Maw, DO    History reviewed. No pertinent family history.   Social History   Tobacco Use  . Smoking status: Never Smoker  . Smokeless tobacco: Never Used  Vaping Use  . Vaping Use: Never used  Substance Use Topics  . Alcohol use: Yes    Comment: occassional  . Drug use: Never    Allergies as of 12/11/2020  . (No Known Allergies)  Review of Systems:    All systems reviewed and negative except where noted in HPI.   Physical Exam:  Vital signs in last 24 hours: Temp:  [97 F (36.1 C)-98.2 F (36.8 C)] 98.2 F (36.8 C) (05/26 1006) Pulse Rate:  [77-112] 85 (05/26 1006) Resp:  [16-33] 18 (05/26 1006) BP: (132-179)/(78-119) 146/104 (05/26 1006) SpO2:  [94 %-100 %] 97 % (05/26 1006) Weight:  [102.1 kg] 102.1 kg (05/25 2352)   General:   Pleasant, cooperative in NAD Head:  Normocephalic and atraumatic. Eyes:   No icterus.   Conjunctiva pink. PERRLA. Ears:  Normal auditory acuity. Neck:  Supple; no masses or thyroidomegaly Lungs:  Respirations even and unlabored. Lungs clear to auscultation bilaterally.   No wheezes, crackles, or rhonchi.  Heart:  Regular rate and rhythm;  Without murmur, clicks, rubs or gallops Abdomen:  Soft, nondistended, diffusely tender. Normal bowel sounds. No appreciable masses or hepatomegaly.  No rebound or guarding.  Rectal:  Not performed. Msk:  Symmetrical without gross deformities.    Extremities:  Without edema, cyanosis or clubbing. Neurologic:  Alert and oriented x3;  grossly normal neurologically. Skin:  Intact without significant lesions or rashes. Cervical Nodes:  No significant cervical adenopathy. Psych:  Alert and cooperative. Normal affect.  LAB RESULTS: Recent Labs    12/11/20 2354  WBC 12.3*  HGB 16.0  HCT 44.1  PLT 662*   BMET Recent Labs    12/11/20 2354  NA 136  K 4.0  CL 100  CO2 19*  GLUCOSE 217*  BUN 12  CREATININE 1.06  CALCIUM 9.6   LFT Recent Labs    12/12/20 0237  PROT 7.2  ALBUMIN 3.9  AST 783*  ALT 1,184*  ALKPHOS 149*  BILITOT 2.6*  BILIDIR 1.5*  IBILI 1.1*   PT/INR No results for input(s): LABPROT, INR in the last 72 hours.  STUDIES: CT ABDOMEN PELVIS WO CONTRAST  Result Date: 12/12/2020 CLINICAL DATA:  Abdominal distension. Gallbladder removed today with worsening abdominal pain and right neck pain. Postop day 0 EXAM: CT ABDOMEN AND PELVIS WITHOUT CONTRAST TECHNIQUE: Multidetector CT imaging of the abdomen and pelvis was performed following the standard protocol without IV contrast. COMPARISON:  Ultrasound abdomen 12/01/2020 FINDINGS: Lower chest: Bilateral lower lobe subsegmental atelectasis/consolidations, right greater than left. Hepatobiliary: No focal liver abnormality. Status post cholecystectomy. Trace fluid along the gallbladder fossa likely postsurgical. No biliary dilatation. Pancreas: No focal lesion. Normal pancreatic contour. No surrounding inflammatory changes. No main pancreatic ductal dilatation. Spleen: Normal in size  without focal abnormality. Adrenals/Urinary Tract: No adrenal nodule bilaterally. No nephrolithiasis, no hydronephrosis, and no contour-deforming renal mass. No ureterolithiasis or hydroureter. The urinary bladder is unremarkable. Stomach/Bowel: Stomach is within normal limits. No evidence of bowel wall thickening or dilatation. Appendix appears normal. Vascular/Lymphatic: No significant vascular findings are present. No enlarged abdominal or pelvic lymph nodes. Reproductive: Prostate is unremarkable. Other: Trace perihepatic/infra diaphragmatic free fluid and foci of gas. Trace free fluid within the pelvis. No organized fluid collection. Musculoskeletal: No abdominal wall hernia or abnormality. No suspicious lytic or blastic osseous lesions. No acute displaced fracture. IMPRESSION: 1. Bilateral lower lobe consolidations, right greater than left, likely represent atelectasis in the postsurgical setting peer 2. Trace perihepatic/infra-diaphragmatic free fluid and foci of gas likely postsurgical in etiology in the setting of post cholecystectomy. Electronically Signed   By: Tish FredericksonMorgane  Naveau M.D.   On: 12/12/2020 00:42   DG Chest Portable 1 View  Result Date: 12/12/2020 CLINICAL DATA:  Short of  breath. Pt had gall bladder removed today here for worsening abd pain and right neck pain. EXAM: PORTABLE CHEST 1 VIEW COMPARISON:  None. FINDINGS: Prominent cardiac silhouette likely due to AP portable technique as well as low lung volumes. The heart size and mediastinal contours are within normal limits. Low lung volumes with bibasilar atelectasis. No pulmonary edema. No pleural effusion. No pneumothorax. No acute osseous abnormality. IMPRESSION: Low lung volumes with bibasilar atelectasis. Electronically Signed   By: Tish Frederickson M.D.   On: 12/12/2020 00:43   MR ABDOMEN MRCP W WO CONTAST  Result Date: 12/12/2020 CLINICAL DATA:  Abdominal pain, suspected biliary duct obstruction. Post cholecystectomy. EXAM: MRI  ABDOMEN WITH AND WITHOUT CONTRAST (WITH MRCP) TECHNIQUE: Multiplanar multisequence MR imaging of the abdomen was performed prior to and following the administration of intravenous contrast. Heavily T2-weighted images of the biliary and pancreatic ducts were obtained, and three-dimensional MRCP images were rendered by post processing. CONTRAST:  67mL GADAVIST GADOBUTROL 1 MMOL/ML IV SOLN COMPARISON:  CT abdomen and pelvis of Dec 12, 2020. FINDINGS: Lower chest: Dense basilar airspace disease at the RIGHT lung base with similar appearance to prior imaging obtained on the same date. Also with LEFT lower lobe airspace disease. Hepatobiliary: Perihepatic ascites. Small amount of fluid in the gallbladder fossa. The common bile duct is of normal caliber. No sign of filling defect in the biliary tree. No focal, suspicious hepatic lesion. Portal vein is patent. Hepatic veins are patent. On MRCP sequences fluid can be seen tracking from the gallbladder fossa incontinuity with small to moderate volume of perihepatic fluid. There is also interloop fluid in the upper abdomen adjacent to the RIGHT colon. This is best seen on T2 weighted imaging on image 37 of series 40 with respect to interloop fluid. Pancreas:  Normal, without mass, inflammation or ductal dilatation. Spleen:  Normal spleen. Adrenals/Urinary Tract: Adrenal glands are normal. Symmetric renal enhancement. No hydronephrosis. Stomach/Bowel: No sign of acute gastrointestinal process to the extent evaluated on abdominal MRI. Vascular/Lymphatic: Patent abdominal vessels. There is no gastrohepatic or hepatoduodenal ligament lymphadenopathy. No retroperitoneal or mesenteric lymphadenopathy. Other: Perihepatic ascites and fluid tracking into the RIGHT upper quadrant in communication with fluid about the gallbladder fossa. Small amount of pneumoperitoneum as on the previous CT anterior to the liver margin. Musculoskeletal: No suspicious bone lesions identified. IMPRESSION:  Small to moderate volume of perihepatic ascites slightly increased since the previous study. There is also some fluid and or small amount of blood inferior to the RIGHT hemi liver. While constellation of findings could be seen in the postoperative setting the slight interval increase in fluid about the liver, in communication with the gallbladder fossa in the setting of increasing bilirubin raises suspicion for biliary leak. Nuclear medicine hepatobiliary scan may be helpful to exclude the possibility of biliary leak. Dense basilar consolidative changes RIGHT greater than LEFT persist likely related to volume loss, difficult to exclude the possibility of developing infection, attention on follow-up. These results will be called to the ordering clinician or representative by the Radiologist Assistant, and communication documented in the PACS or [employer]. Electronically Signed   By: Donzetta Kohut M.D.   On: 12/12/2020 11:41      Impression / Plan:   Assessment: Active Problems:   Post-operative pain   Nicholas Gallagher is a 40 y.o. y/o male with a laparoscopic cholecystectomy with continued abdominal pain and denies any Tylenol abuse upon discharge.  He only took 2 pain medications that contained 325 mg of Tylenol  in each dose.  It appears that he also got Tylenol IV at the time of surgery.  The patient's liver enzymes being this significantly elevated would lead me to think that this is a medication reaction since there is no sign of ischemia during his hospitalization and it is unlikely he developed an acute viral infection which would explain LFTs over thousand.  The patient has been sent down for a HIDA scan to look for a bile leak.  The study showed there to be a large leak as reported by the radiologist who read the HIDA scan.  Plan:  The patient will be set up for an ERCP with a stent placement for today.  The patient and his wife have been explained the risks and benefits including  pancreatitis bleeding perforation and death.  They agreed to undergo the procedure today.  Thank you for involving me in the care of this patient.      LOS: 0 days   Midge Minium, MD, [facility] 12/12/2020, 2:37 PM,  Pager [phone] 7am-5pm  Check AMION for 5pm -7am coverage and on weekends   Note: This dictation was prepared with Dragon dictation along with smaller phrase technology. Any transcriptional errors that result from this process are unintentional.

## 2020-12-13 ENCOUNTER — Encounter: Payer: Self-pay | Admitting: Gastroenterology

## 2020-12-13 LAB — HEPATIC FUNCTION PANEL
ALT: 598 U/L — ABNORMAL HIGH (ref 0–44)
AST: 144 U/L — ABNORMAL HIGH (ref 15–41)
Albumin: 3.5 g/dL (ref 3.5–5.0)
Alkaline Phosphatase: 151 U/L — ABNORMAL HIGH (ref 38–126)
Bilirubin, Direct: 0.6 mg/dL — ABNORMAL HIGH (ref 0.0–0.2)
Indirect Bilirubin: 0.9 mg/dL (ref 0.3–0.9)
Total Bilirubin: 1.5 mg/dL — ABNORMAL HIGH (ref 0.3–1.2)
Total Protein: 6.5 g/dL (ref 6.5–8.1)

## 2020-12-13 LAB — CBC WITH DIFFERENTIAL/PLATELET
Abs Immature Granulocytes: 0.14 10*3/uL — ABNORMAL HIGH (ref 0.00–0.07)
Basophils Absolute: 0 10*3/uL (ref 0.0–0.1)
Basophils Relative: 0 %
Eosinophils Absolute: 0 10*3/uL (ref 0.0–0.5)
Eosinophils Relative: 0 %
HCT: 44.1 % (ref 39.0–52.0)
Hemoglobin: 14.9 g/dL (ref 13.0–17.0)
Immature Granulocytes: 1 %
Lymphocytes Relative: 7 %
Lymphs Abs: 1.3 10*3/uL (ref 0.7–4.0)
MCH: 30.2 pg (ref 26.0–34.0)
MCHC: 33.8 g/dL (ref 30.0–36.0)
MCV: 89.3 fL (ref 80.0–100.0)
Monocytes Absolute: 1.7 10*3/uL — ABNORMAL HIGH (ref 0.1–1.0)
Monocytes Relative: 9 %
Neutro Abs: 15.5 10*3/uL — ABNORMAL HIGH (ref 1.7–7.7)
Neutrophils Relative %: 83 %
Platelets: 446 10*3/uL — ABNORMAL HIGH (ref 150–400)
RBC: 4.94 MIL/uL (ref 4.22–5.81)
RDW: 12.4 % (ref 11.5–15.5)
WBC: 18.7 10*3/uL — ABNORMAL HIGH (ref 4.0–10.5)
nRBC: 0 % (ref 0.0–0.2)

## 2020-12-13 LAB — SURGICAL PATHOLOGY

## 2020-12-13 LAB — LIPASE, BLOOD: Lipase: 1544 U/L — ABNORMAL HIGH (ref 11–51)

## 2020-12-13 MED ORDER — ACETAMINOPHEN 325 MG PO TABS
650.0000 mg | ORAL_TABLET | ORAL | Status: DC
  Administered 2020-12-13 – 2020-12-16 (×11): 650 mg via ORAL
  Filled 2020-12-13 (×12): qty 2

## 2020-12-13 NOTE — Plan of Care (Signed)
Pt ambulated around nurses stations x2 this am and tolerated well. Problem: Education: Goal: Knowledge of General Education information will improve Description: Including pain rating scale, medication(s)/side effects and non-pharmacologic comfort measures Outcome: Progressing   Problem: Health Behavior/Discharge Planning: Goal: Ability to manage health-related needs will improve Outcome: Progressing   Problem: Activity: Goal: Risk for activity intolerance will decrease Outcome: Progressing

## 2020-12-13 NOTE — Progress Notes (Signed)
Afebrile, vital signs stable.  Transient hypoxia this morning requiring O2, resolved.  Lungs: Clear, shallow inspiration secondary to abdominal discomfort.  Cardiac exam showed a regular rhythm.  Abdomen: Mild distention, mild diffuse tenderness.  Bowel sounds present.  Extremities: Soft.  Laboratory: White blood cell count up with left shift, liver function studies markedly improved.  Lipase of 1500 (mild postop ERCP pancreatitis).  Reviewed slow GI function secondary to chemical irritation from bile spill and aggravated by mild pancreatitis.  Patient has been aggressively ambulating as requested.  We will work on pulmonary toilet.  Continue Cipro.  Follow lipase and hepatic function studies.  Clear liquid diet at this time until it is clear that pancreatitis is resolving.

## 2020-12-13 NOTE — Progress Notes (Signed)
The patient had an ERCP yesterday with a stent placement.  The patient's liver enzymes have improved with his lipase showing an elevation consistent with posterior pancreatitis.  The patient's white cell count also increased.  The patient states he felt better right after having the procedure but then started to have worsening of his abdominal pain.  The patient should have his lipase and white cell count followed.  If his abdominal pain does not resolve then a repeat CT scan should be done to rule out any sort of pancreatic phlegmon or pancreatic necrosis from his pancreas.  The patient has been explained the plan and agrees with it.

## 2020-12-14 ENCOUNTER — Encounter: Payer: Self-pay | Admitting: General Surgery

## 2020-12-14 ENCOUNTER — Inpatient Hospital Stay: Payer: BC Managed Care – PPO

## 2020-12-14 LAB — HEPATIC FUNCTION PANEL
ALT: 349 U/L — ABNORMAL HIGH (ref 0–44)
AST: 49 U/L — ABNORMAL HIGH (ref 15–41)
Albumin: 3.4 g/dL — ABNORMAL LOW (ref 3.5–5.0)
Alkaline Phosphatase: 121 U/L (ref 38–126)
Bilirubin, Direct: 0.8 mg/dL — ABNORMAL HIGH (ref 0.0–0.2)
Indirect Bilirubin: 1.1 mg/dL — ABNORMAL HIGH (ref 0.3–0.9)
Total Bilirubin: 1.9 mg/dL — ABNORMAL HIGH (ref 0.3–1.2)
Total Protein: 6.6 g/dL (ref 6.5–8.1)

## 2020-12-14 LAB — CBC WITH DIFFERENTIAL/PLATELET
Abs Immature Granulocytes: 0.18 10*3/uL — ABNORMAL HIGH (ref 0.00–0.07)
Basophils Absolute: 0 10*3/uL (ref 0.0–0.1)
Basophils Relative: 0 %
Eosinophils Absolute: 0 10*3/uL (ref 0.0–0.5)
Eosinophils Relative: 0 %
HCT: 40.6 % (ref 39.0–52.0)
Hemoglobin: 14 g/dL (ref 13.0–17.0)
Immature Granulocytes: 1 %
Lymphocytes Relative: 5 %
Lymphs Abs: 1.3 10*3/uL (ref 0.7–4.0)
MCH: 30.9 pg (ref 26.0–34.0)
MCHC: 34.5 g/dL (ref 30.0–36.0)
MCV: 89.6 fL (ref 80.0–100.0)
Monocytes Absolute: 1.7 10*3/uL — ABNORMAL HIGH (ref 0.1–1.0)
Monocytes Relative: 7 %
Neutro Abs: 21.3 10*3/uL — ABNORMAL HIGH (ref 1.7–7.7)
Neutrophils Relative %: 87 %
Platelets: 422 10*3/uL — ABNORMAL HIGH (ref 150–400)
RBC: 4.53 MIL/uL (ref 4.22–5.81)
RDW: 12.7 % (ref 11.5–15.5)
WBC: 24.4 10*3/uL — ABNORMAL HIGH (ref 4.0–10.5)
nRBC: 0 % (ref 0.0–0.2)

## 2020-12-14 LAB — STREP PNEUMONIAE URINARY ANTIGEN: Strep Pneumo Urinary Antigen: NEGATIVE

## 2020-12-14 LAB — BASIC METABOLIC PANEL
Anion gap: 9 (ref 5–15)
BUN: 12 mg/dL (ref 6–20)
CO2: 28 mmol/L (ref 22–32)
Calcium: 8.4 mg/dL — ABNORMAL LOW (ref 8.9–10.3)
Chloride: 97 mmol/L — ABNORMAL LOW (ref 98–111)
Creatinine, Ser: 0.85 mg/dL (ref 0.61–1.24)
GFR, Estimated: >60 mL/min (ref >60)
Glucose, Bld: 144 mg/dL — ABNORMAL HIGH (ref 70–99)
Potassium: 3.7 mmol/L (ref 3.5–5.1)
Sodium: 134 mmol/L — ABNORMAL LOW (ref 135–145)

## 2020-12-14 LAB — RESPIRATORY PANEL BY PCR

## 2020-12-14 LAB — LIPASE, BLOOD: Lipase: 372 U/L — ABNORMAL HIGH (ref 11–51)

## 2020-12-14 LAB — MRSA PCR SCREENING: MRSA by PCR: NEGATIVE

## 2020-12-14 LAB — PROCALCITONIN: Procalcitonin: 0.67 ng/mL

## 2020-12-14 MED ORDER — FUROSEMIDE 40 MG PO TABS
40.0000 mg | ORAL_TABLET | Freq: Every day | ORAL | Status: DC
  Administered 2020-12-14 – 2020-12-15 (×2): 40 mg via ORAL
  Filled 2020-12-14 (×2): qty 1

## 2020-12-14 MED ORDER — METOCLOPRAMIDE HCL 10 MG PO TABS
10.0000 mg | ORAL_TABLET | Freq: Three times a day (TID) | ORAL | Status: DC
  Administered 2020-12-14 – 2020-12-20 (×21): 10 mg via ORAL
  Filled 2020-12-14 (×21): qty 1

## 2020-12-14 MED ORDER — IOHEXOL 9 MG/ML PO SOLN
500.0000 mL | ORAL | Status: AC
  Administered 2020-12-14 (×2): 500 mL via ORAL

## 2020-12-14 MED ORDER — IOHEXOL 300 MG/ML  SOLN
100.0000 mL | Freq: Once | INTRAMUSCULAR | Status: AC | PRN
  Administered 2020-12-14: 100 mL via INTRAVENOUS

## 2020-12-14 NOTE — Consult Note (Signed)
Pulmonary Medicine          Date: 12/14/2020,   MRN# [id number] Nicholas Gallagher [birth year]     AdmissionWeight: 102.1 kg                 CurrentWeight: 102.1 kg   Referring physician: Dr Lemar Livings   CHIEF COMPLAINT:   Febrile illness with abnormal chest imaging   HISTORY OF PRESENT ILLNESS    This 40 year old male s/p lap chole 5/25, he does not have baseline medical problems and generally does not take medication. He had abd discomfort with flatus post operatively.  He has developed some pain and noted to have shallow breathing.   He was noted to have fever >102 and tachycardia.  He had  CT imaging done which I reviewed personally.   Labwork significant for leukocytosis and transaminitis, with improved elevation of tbili.  He is urinating well and BP is stable.       PAST MEDICAL HISTORY   Past Medical History:  Diagnosis Date  . Gallstones      SURGICAL HISTORY   Past Surgical History:  Procedure Laterality Date  . CHOLECYSTECTOMY N/A 12/11/2020   Procedure: LAPAROSCOPIC CHOLECYSTECTOMY WITH INTRAOPERATIVE CHOLANGIOGRAM;  Surgeon: Earline Mayotte, MD;  Location: [facility] ORS;  Service: General;  Laterality: N/A;  . ERCP N/A 12/12/2020   Procedure: ENDOSCOPIC RETROGRADE CHOLANGIOPANCREATOGRAPHY (ERCP);  Surgeon: Midge Minium, MD;  Location: [facility] ENDOSCOPY;  Service: Endoscopy;  Laterality: N/A;  . NO PAST SURGERIES       FAMILY HISTORY   History reviewed. No pertinent family history.   SOCIAL HISTORY   Social History   Tobacco Use  . Smoking status: Never Smoker  . Smokeless tobacco: Never Used  Vaping Use  . Vaping Use: Never used  Substance Use Topics  . Alcohol use: Yes    Comment: occassional  . Drug use: Never     MEDICATIONS    Home Medication:    Current Medication:  Current Facility-Administered Medications:  .  acetaminophen (TYLENOL) tablet 650 mg, 650 mg, Oral, Q4H while awake, [name], Merrily Pew, MD, 650 mg at 12/14/20  1205 .  ciprofloxacin (CIPRO) IVPB 400 mg, 400 mg, Intravenous, Q12H, [name], Merrily Pew, MD, Last Rate: 200 mL/hr at 12/14/20 0244, 400 mg at 12/14/20 0244 .  enoxaparin (LOVENOX) injection 40 mg, 40 mg, Subcutaneous, Q24H, [name], Merrily Pew, MD .  HYDROmorphone (DILAUDID) injection 1 mg, 1 mg, Intravenous, Q3H PRN, Earline Mayotte, MD, 1 mg at 12/14/20 1245 .  lactated ringers infusion, , Intravenous, Continuous, [name], Merrily Pew, MD, Last Rate: 10 mL/hr at 12/14/20 1205, Rate Change at 12/14/20 1205 .  metoCLOPramide (REGLAN) tablet 10 mg, 10 mg, Oral, TID AC & HS, [name], Merrily Pew, MD, 10 mg at 12/14/20 1206 .  ondansetron (ZOFRAN) injection 4 mg, 4 mg, Intravenous, Q4H PRN, [name], Merrily Pew, MD .  oxyCODONE (Oxy IR/ROXICODONE) immediate release tablet 10 mg, 10 mg, Oral, Q4H PRN, Earline Mayotte, MD, 10 mg at 12/14/20 1205    ALLERGIES   Patient has no known allergies.     REVIEW OF SYSTEMS    Review of Systems:  Gen:  Denies  fever, sweats, chills weigh loss  HEENT: Denies blurred vision, double vision, ear pain, eye pain, hearing loss, nose bleeds, sore throat Cardiac:  No dizziness, chest pain or heaviness, chest tightness,edema Resp:   Denies cough or sputum porduction, shortness of breath,wheezing, hemoptysis,  Gi: Denies swallowing difficulty, stomach pain, nausea or  vomiting, diarrhea, constipation, bowel incontinence Gu:  Denies bladder incontinence, burning urine Ext:   Denies Joint pain, stiffness or swelling Skin: Denies  skin rash, easy bruising or bleeding or hives Endoc:  Denies polyuria, polydipsia , polyphagia or weight change Psych:   Denies depression, insomnia or hallucinations   Other:  All other systems negative   VS: BP (!) 138/93 (BP Location: Left Arm)   Pulse (!) 107   Temp (!) 102.3 F (39.1 C) (Oral)   Resp 20   Ht 6\' 1"  (1.854 m)   Wt 102.1 kg   SpO2 90%   BMI 29.70 kg/m      PHYSICAL EXAM    GENERAL:NAD, no fevers,  chills, no weakness no fatigue HEAD: Normocephalic, atraumatic.  EYES: Pupils equal, round, reactive to light. Extraocular muscles intact. No scleral icterus.  MOUTH: Moist mucosal membrane. Dentition intact. No abscess noted.  EAR, NOSE, THROAT: Clear without exudates. No external lesions.  NECK: Supple. No thyromegaly. No nodules. No JVD.  PULMONARY: Diffuse coarse rhonchi right sided +wheezes CARDIOVASCULAR: S1 and S2. Regular rate and rhythm. No murmurs, rubs, or gallops. No edema. Pedal pulses 2+ bilaterally.  GASTROINTESTINAL: Soft, nontender, nondistended. No masses. Positive bowel sounds. No hepatosplenomegaly.  MUSCULOSKELETAL: No swelling, clubbing, or edema. Range of motion full in all extremities.  NEUROLOGIC: Cranial nerves II through XII are intact. No gross focal neurological deficits. Sensation intact. Reflexes intact.  SKIN: No ulceration, lesions, rashes, or cyanosis. Skin warm and dry. Turgor intact.  PSYCHIATRIC: Mood, affect within normal limits. The patient is awake, alert and oriented x 3. Insight, judgment intact.       IMAGING    CT ABDOMEN PELVIS WO CONTRAST  Result Date: 12/12/2020 CLINICAL DATA:  Abdominal distension. Gallbladder removed today with worsening abdominal pain and right neck pain. Postop day 0 EXAM: CT ABDOMEN AND PELVIS WITHOUT CONTRAST TECHNIQUE: Multidetector CT imaging of the abdomen and pelvis was performed following the standard protocol without IV contrast. COMPARISON:  Ultrasound abdomen 12/01/2020 FINDINGS: Lower chest: Bilateral lower lobe subsegmental atelectasis/consolidations, right greater than left. Hepatobiliary: No focal liver abnormality. Status post cholecystectomy. Trace fluid along the gallbladder fossa likely postsurgical. No biliary dilatation. Pancreas: No focal lesion. Normal pancreatic contour. No surrounding inflammatory changes. No main pancreatic ductal dilatation. Spleen: Normal in size without focal abnormality.  Adrenals/Urinary Tract: No adrenal nodule bilaterally. No nephrolithiasis, no hydronephrosis, and no contour-deforming renal mass. No ureterolithiasis or hydroureter. The urinary bladder is unremarkable. Stomach/Bowel: Stomach is within normal limits. No evidence of bowel wall thickening or dilatation. Appendix appears normal. Vascular/Lymphatic: No significant vascular findings are present. No enlarged abdominal or pelvic lymph nodes. Reproductive: Prostate is unremarkable. Other: Trace perihepatic/infra diaphragmatic free fluid and foci of gas. Trace free fluid within the pelvis. No organized fluid collection. Musculoskeletal: No abdominal wall hernia or abnormality. No suspicious lytic or blastic osseous lesions. No acute displaced fracture. IMPRESSION: 1. Bilateral lower lobe consolidations, right greater than left, likely represent atelectasis in the postsurgical setting peer 2. Trace perihepatic/infra-diaphragmatic free fluid and foci of gas likely postsurgical in etiology in the setting of post cholecystectomy. Electronically Signed   By: 12/03/2020 M.D.   On: 12/12/2020 00:42   CT ABDOMEN PELVIS W CONTRAST  Result Date: 12/14/2020 CLINICAL DATA:  Acute, severe pancreatitis EXAM: CT ABDOMEN AND PELVIS WITH CONTRAST TECHNIQUE: Multidetector CT imaging of the abdomen and pelvis was performed using the standard protocol following bolus administration of intravenous contrast. CONTRAST:  12/16/2020 OMNIPAQUE IOHEXOL 300 MG/ML  SOLN COMPARISON:  Abdominal MRI 12/12/2020 FINDINGS: Lower chest: Multi segment atelectasis at the lung bases with small pleural effusions. Hepatobiliary: No focal liver abnormality.Recent cholecystectomy. Gas and fluid collection without defined wall in the gallbladder fossa, 4.4 x 2.3 cm on axial slices. There is a stent in the CBD without ductal dilatation or malpositioning. A retention loop is seen at the level of the duodenum. Pancreas: Peripancreatic edema and generalized mild  pancreas expansion. No necrosis or pancreatic collection. No pancreatic ductal dilatation. Spleen: Unremarkable. Adrenals/Urinary Tract: Negative adrenals. No hydronephrosis or stone. Unremarkable bladder. Stomach/Bowel:  No obstruction. No appendicitis. Vascular/Lymphatic: No acute vascular abnormality. No mass or adenopathy. Reproductive:No pathologic findings. Other: No ascites or pneumoperitoneum.  Small volume ascites Musculoskeletal: No acute abnormalities. IMPRESSION: 1. Non organized gas and fluid collection in the gallbladder fossa measuring up to 4.4 x 2.3 cm. 2. Acute edematous pancreatitis. The CBD stent is in good position with no ductal dilatation. 3. Multi segment atelectasis with small pleural effusions. Small volume non loculated ascites. Electronically Signed   By: Marnee Spring M.D.   On: 12/14/2020 10:30   DG Chest Portable 1 View  Result Date: 12/12/2020 CLINICAL DATA:  Short of breath. Pt had gall bladder removed today here for worsening abd pain and right neck pain. EXAM: PORTABLE CHEST 1 VIEW COMPARISON:  None. FINDINGS: Prominent cardiac silhouette likely due to AP portable technique as well as low lung volumes. The heart size and mediastinal contours are within normal limits. Low lung volumes with bibasilar atelectasis. No pulmonary edema. No pleural effusion. No pneumothorax. No acute osseous abnormality. IMPRESSION: Low lung volumes with bibasilar atelectasis. Electronically Signed   By: Tish Frederickson M.D.   On: 12/12/2020 00:43   DG C-Arm 1-60 Min-No Report  Result Date: 12/12/2020 Fluoroscopy was utilized by the requesting physician.  No radiographic interpretation.   MR ABDOMEN MRCP W WO CONTAST  Result Date: 12/12/2020 CLINICAL DATA:  Abdominal pain, suspected biliary duct obstruction. Post cholecystectomy. EXAM: MRI ABDOMEN WITH AND WITHOUT CONTRAST (WITH MRCP) TECHNIQUE: Multiplanar multisequence MR imaging of the abdomen was performed prior to and following the  administration of intravenous contrast. Heavily T2-weighted images of the biliary and pancreatic ducts were obtained, and three-dimensional MRCP images were rendered by post processing. CONTRAST:  66mL GADAVIST GADOBUTROL 1 MMOL/ML IV SOLN COMPARISON:  CT abdomen and pelvis of Dec 12, 2020. FINDINGS: Lower chest: Dense basilar airspace disease at the RIGHT lung base with similar appearance to prior imaging obtained on the same date. Also with LEFT lower lobe airspace disease. Hepatobiliary: Perihepatic ascites. Small amount of fluid in the gallbladder fossa. The common bile duct is of normal caliber. No sign of filling defect in the biliary tree. No focal, suspicious hepatic lesion. Portal vein is patent. Hepatic veins are patent. On MRCP sequences fluid can be seen tracking from the gallbladder fossa incontinuity with small to moderate volume of perihepatic fluid. There is also interloop fluid in the upper abdomen adjacent to the RIGHT colon. This is best seen on T2 weighted imaging on image 37 of series 40 with respect to interloop fluid. Pancreas:  Normal, without mass, inflammation or ductal dilatation. Spleen:  Normal spleen. Adrenals/Urinary Tract: Adrenal glands are normal. Symmetric renal enhancement. No hydronephrosis. Stomach/Bowel: No sign of acute gastrointestinal process to the extent evaluated on abdominal MRI. Vascular/Lymphatic: Patent abdominal vessels. There is no gastrohepatic or hepatoduodenal ligament lymphadenopathy. No retroperitoneal or mesenteric lymphadenopathy. Other: Perihepatic ascites and fluid tracking into the RIGHT upper quadrant in communication  with fluid about the gallbladder fossa. Small amount of pneumoperitoneum as on the previous CT anterior to the liver margin. Musculoskeletal: No suspicious bone lesions identified. IMPRESSION: Small to moderate volume of perihepatic ascites slightly increased since the previous study. There is also some fluid and or small amount of blood  inferior to the RIGHT hemi liver. While constellation of findings could be seen in the postoperative setting the slight interval increase in fluid about the liver, in communication with the gallbladder fossa in the setting of increasing bilirubin raises suspicion for biliary leak. Nuclear medicine hepatobiliary scan may be helpful to exclude the possibility of biliary leak. Dense basilar consolidative changes RIGHT greater than LEFT persist likely related to volume loss, difficult to exclude the possibility of developing infection, attention on follow-up. These results will be called to the ordering clinician or representative by the Radiologist Assistant, and communication documented in the PACS or [employer] Dashboard. Electronically Signed   By: Donzetta KohutGeoffrey  Wile M.D.   On: 12/12/2020 11:41   NM HEPATOBILIARY LEAK (POST-SURGICAL)  Result Date: 12/12/2020 CLINICAL DATA:  Status post cholecystectomy. Perihepatic fluid on recent MRI. Evaluate for postop bile leak. EXAM: NUCLEAR MEDICINE HEPATOBILIARY IMAGING TECHNIQUE: Sequential images of the abdomen were obtained out to 60 minutes following intravenous administration of radiopharmaceutical. RADIOPHARMACEUTICALS:  7.4 mCi Tc-3681m  Choletec IV COMPARISON:  None. FINDINGS: Prompt uptake and biliary excretion of activity by the liver is seen. Patient is status post cholecystectomy. Accumulation of biliary activity is seen in the gallbladder fossa, which then shows progressive extension into the perihepatic spaces, consistent with postop bile leak. No biliary activity reaches the small bowel, however patency of the common bile duct cannot be determined due to the large bile leak. IMPRESSION: Large post-op bile leak. No biliary activity reaches small bowel, however common bile duct patency cannot be determined due to the large bowel loop. These results will be called to the ordering clinician or representative by the Radiologist Assistant, and communication documented in  the PACS or [employer] Dashboard. Electronically Signed   By: Danae OrleansJohn A Stahl M.D.   On: 12/12/2020 15:20   US Abdomen Limited RUQ (LIVER/GB)  Result Date: 12/01/2020 CLINICAL DATA:  Epigastric pain EXAM: ULTRASOUND ABDOMEN LIMITED RIGHT UPPER QUADRANT COMPARISON:  None. FINDINGS: Gallbladder: Cholelithiasis including an 8 mm stone at the neck. No gallbladder wall thickening but there is focal tenderness by sonographer exam. No pericholecystic fluid. Common bile duct: Diameter: 5 mm. Liver: Echogenic liver with diminished acoustic penetration. Portal vein is patent on color Doppler imaging with normal direction of blood flow towards the liver. Other: None. IMPRESSION: 1. Cholelithiasis including stone at the gallbladder neck. There is focal tenderness which could reflect gallbladder obstruction, but no wall thickening or edema typical of acute cholecystitis. 2. Hepatic steatosis. Electronically Signed   By: Marnee SpringJonathon  Watts M.D.   On: 12/01/2020 05:46      ASSESSMENT/PLAN    Acute febrile illness with hypoxemia -bibasilar atelectasis cannot rule out consolidation/infiltrate -will place on empiric levoquin PO once daily -MRSA pcr -REsp viral panel - COVID19 negative - supplemental O2 during my evaluation room air - will perform infectious workup for pneumonia -Respiratory viral panel -serum fungitell -legionella ab -strep pneumoniae ur AG -sputum resp cultures -reviewed pertinent imaging with patient today -PT/OT for d/c planning    Bibasilar atelectasis  - Incentive spirometer - patient takes tidal volume 1500cc   - patient will need MetaNEB with saline for recruitment      Thank you for allowing me  to participate in the care of this patient.   Patient/Family are satisfied with care plan and all questions have been answered.  This document was prepared using Dragon voice recognition software and may include unintentional dictation errors.     Vida Rigger, M.D.  Division of  Pulmonary & Critical Care Medicine  [facility]

## 2020-12-14 NOTE — Progress Notes (Signed)
   12/14/20 1405  Assess: MEWS Score  Temp (!) 100.9 F (38.3 C)  BP (!) 151/97  Pulse Rate (!) 108  Resp 18  SpO2 94 %  Assess: MEWS Score  MEWS Temp 1  MEWS Systolic 0  MEWS Pulse 1  MEWS RR 0  MEWS LOC 0  MEWS Score 2  MEWS Score Color Yellow  Assess: if the MEWS score is Yellow or Red  Were vital signs taken at a resting state? Yes  Focused Assessment No change from prior assessment  Early Detection of Sepsis Score *See Row Information* Low  MEWS guidelines implemented *See Row Information* Yes  Treat  Pain Scale 0-10  Pain Score 0  Take Vital Signs  Increase Vital Sign Frequency  Yellow: Q 2hr X 2 then Q 4hr X 2, if remains yellow, continue Q 4hrs  Escalate  MEWS: Escalate Yellow: discuss with charge nurse/RN and consider discussing with provider and RRT  Notify: Charge Nurse/RN  Name of Charge Nurse/RN Notified Tresa Endo  Date Charge Nurse/RN Notified 12/14/20  Time Charge Nurse/RN Notified 1405  Notify: Provider  Provider Name/Title Karna Christmas  Date Provider Notified 12/14/20  Time Provider Notified 1410  Notification Type Rounds  Notification Reason Other (Comment) (Consult)  Provider response En route  Document  Patient Outcome Stabilized after interventions

## 2020-12-14 NOTE — Progress Notes (Signed)
2 Days Post-Op   Subjective/Chief Complaint: Afebrile, but tachycardic last PM, better this AM. No nausea. + appetite. Passing flatus, less burping. Using incentive spirometer at 1000+. Pain still significant, gas cramping by description.    Objective: Vital signs in last 24 hours: Temp:  [98 F (36.7 C)-99.8 F (37.7 C)] 99.8 F (37.7 C) (05/28 0539) Pulse Rate:  [96-125] 105 (05/28 0539) Resp:  [18-20] 20 (05/28 0539) BP: (137-146)/(92-96) 146/92 (05/28 0539) SpO2:  [90 %-93 %] 90 % (05/28 0539)   Lungs: Clear. Cardio: RR. Rate 105. ABD: Distended, tympanitic.Softer. Wounds: Clean. Sclera: Clear.  Calves: Soft.    Intake/Output from previous day: 05/27 0701 - 05/28 0700 In: 3964.7 [P.O.:1300; I.V.:2278.5; IV Piggyback:386.2] Out: -  Intake/Output this shift: No intake/output data recorded.  Incision/Wound:  Lab Results:  Recent Labs    12/13/20 0729 12/14/20 0544  WBC 18.7* 24.4*  HGB 14.9 14.0  HCT 44.1 40.6  PLT 446* 422*   BMET Recent Labs    12/11/20 2354 12/14/20 0544  NA 136 134*  K 4.0 3.7  CL 100 97*  CO2 19* 28  GLUCOSE 217* 144*  BUN 12 12  CREATININE 1.06 0.85  CALCIUM 9.6 8.4*    Anti-infectives: Anti-infectives (From admission, onward)   Start     Dose/Rate Route Frequency Ordered Stop   12/12/20 0330  ciprofloxacin (CIPRO) IVPB 400 mg        400 mg 200 mL/hr over 60 Minutes Intravenous Every 12 hours 12/12/20 0321        Assessment/Plan: s/p Procedure(s): ENDOSCOPIC RETROGRADE CHOLANGIOPANCREATOGRAPHY (ERCP) (N/A)   LOS: 2 days  Pancreatitis improving based on falling lipase. Rising WBC could be fluid related to early leak or incomplete diversion with stent. Less likely severe pancreatitis with falling lipase (although Ca  2+ is down). CT in ED on 5/26.2022 AM showed bibasilar atelectasis. This could present with modest hypoxia, tachycardia.   If significant subhepatic fluid collection, would look for percutaneous  drainage by IR.  Reviewed labs/ plan for CT with patient and wife.  Further plans based on CT results.   Merrily Pew [facility] 12/14/2020

## 2020-12-14 NOTE — Plan of Care (Signed)

## 2020-12-14 NOTE — Progress Notes (Signed)
CT reviewed, radiology notes reviewed.   Pancreatitis and bilateral lower lobe atelectasis likely responsible for leukocytosis.  No SB dilatation.  Plan: Hold diet on clear liquids for the present.  Cut back IV fluids. Follow labs.

## 2020-12-14 NOTE — Progress Notes (Signed)
   12/14/20 1127  Assess: MEWS Score  Temp (!) 102.3 F (39.1 C)  BP (!) 138/93  Pulse Rate (!) 107  Resp 20  SpO2 90 %  Assess: MEWS Score  MEWS Temp 2  MEWS Systolic 0  MEWS Pulse 1  MEWS RR 0  MEWS LOC 0  MEWS Score 3  MEWS Score Color Yellow  Assess: if the MEWS score is Yellow or Red  Were vital signs taken at a resting state? Yes  Focused Assessment No change from prior assessment  Early Detection of Sepsis Score *See Row Information* Low  MEWS guidelines implemented *See Row Information* Yes  Treat  Pain Scale 0-10  Pain Score 0  Pain Type Chronic pain  Take Vital Signs  Increase Vital Sign Frequency  Yellow: Q 2hr X 2 then Q 4hr X 2, if remains yellow, continue Q 4hrs  Escalate  MEWS: Escalate Yellow: discuss with charge nurse/RN and consider discussing with provider and RRT  Notify: Charge Nurse/RN  Name of Charge Nurse/RN Notified Tresa Endo  Date Charge Nurse/RN Notified 12/14/20  Time Charge Nurse/RN Notified 1145  Notify: Provider  Provider Name/Title [name]  Date Provider Notified 12/14/20  Time Provider Notified 1200  Notification Type Rounds  Notification Reason Other (Comment)  Provider response See new orders  Date of Provider Response 12/14/20  Time of Provider Response 1200  Document  Patient Outcome Stabilized after interventions

## 2020-12-14 NOTE — Progress Notes (Signed)
   12/14/20 1543  Assess: MEWS Score  Temp 99.7 F (37.6 C)  BP (!) 150/93  Pulse Rate (!) 109  Resp 20  SpO2 97 %  Assess: MEWS Score  MEWS Temp 0  MEWS Systolic 0  MEWS Pulse 1  MEWS RR 0  MEWS LOC 0  MEWS Score 1  MEWS Score Color Green  Assess: if the MEWS score is Yellow or Red  Were vital signs taken at a resting state? Yes  Focused Assessment No change from prior assessment  Early Detection of Sepsis Score *See Row Information* Low  MEWS guidelines implemented *See Row Information* No, previously yellow, continue vital signs every 4 hours  Treat  MEWS Interventions Administered scheduled meds/treatments  Pain Scale 0-10  Pain Score 0

## 2020-12-15 ENCOUNTER — Inpatient Hospital Stay: Payer: BC Managed Care – PPO

## 2020-12-15 ENCOUNTER — Encounter: Payer: Self-pay | Admitting: General Surgery

## 2020-12-15 LAB — CBC WITH DIFFERENTIAL/PLATELET
Abs Immature Granulocytes: 0.49 K/uL — ABNORMAL HIGH (ref 0.00–0.07)
Basophils Absolute: 0.1 K/uL (ref 0.0–0.1)
Basophils Relative: 0 %
Eosinophils Absolute: 0 K/uL (ref 0.0–0.5)
Eosinophils Relative: 0 %
HCT: 39.5 % (ref 39.0–52.0)
Hemoglobin: 13.7 g/dL (ref 13.0–17.0)
Immature Granulocytes: 2 %
Lymphocytes Relative: 3 %
Lymphs Abs: 1 K/uL (ref 0.7–4.0)
MCH: 30.8 pg (ref 26.0–34.0)
MCHC: 34.7 g/dL (ref 30.0–36.0)
MCV: 88.8 fL (ref 80.0–100.0)
Monocytes Absolute: 2.4 K/uL — ABNORMAL HIGH (ref 0.1–1.0)
Monocytes Relative: 8 %
Neutro Abs: 28.2 K/uL — ABNORMAL HIGH (ref 1.7–7.7)
Neutrophils Relative %: 87 %
Platelets: 417 K/uL — ABNORMAL HIGH (ref 150–400)
RBC: 4.45 MIL/uL (ref 4.22–5.81)
RDW: 12.6 % (ref 11.5–15.5)
Smear Review: NORMAL
WBC: 32.2 K/uL — ABNORMAL HIGH (ref 4.0–10.5)
nRBC: 0 % (ref 0.0–0.2)

## 2020-12-15 LAB — HEPATIC FUNCTION PANEL
ALT: 213 U/L — ABNORMAL HIGH (ref 0–44)
AST: 33 U/L (ref 15–41)
Albumin: 3.1 g/dL — ABNORMAL LOW (ref 3.5–5.0)
Alkaline Phosphatase: 126 U/L (ref 38–126)
Bilirubin, Direct: 1.1 mg/dL — ABNORMAL HIGH (ref 0.0–0.2)
Indirect Bilirubin: 1 mg/dL — ABNORMAL HIGH (ref 0.3–0.9)
Total Bilirubin: 2.1 mg/dL — ABNORMAL HIGH (ref 0.3–1.2)
Total Protein: 6.8 g/dL (ref 6.5–8.1)

## 2020-12-15 LAB — BASIC METABOLIC PANEL
Anion gap: 11 (ref 5–15)
BUN: 7 mg/dL (ref 6–20)
CO2: 27 mmol/L (ref 22–32)
Calcium: 8.5 mg/dL — ABNORMAL LOW (ref 8.9–10.3)
Chloride: 92 mmol/L — ABNORMAL LOW (ref 98–111)
Creatinine, Ser: 0.89 mg/dL (ref 0.61–1.24)
GFR, Estimated: >60 mL/min (ref >60)
Glucose, Bld: 138 mg/dL — ABNORMAL HIGH (ref 70–99)
Potassium: 4.2 mmol/L (ref 3.5–5.1)
Sodium: 130 mmol/L — ABNORMAL LOW (ref 135–145)

## 2020-12-15 LAB — LIPASE, BLOOD: Lipase: 90 U/L — ABNORMAL HIGH (ref 11–51)

## 2020-12-15 LAB — LACTIC ACID, PLASMA: Lactic Acid, Venous: 1.4 mmol/L (ref 0.5–1.9)

## 2020-12-15 LAB — LACTATE DEHYDROGENASE: LDH: 213 U/L — ABNORMAL HIGH (ref 98–192)

## 2020-12-15 LAB — PROCALCITONIN: Procalcitonin: 0.91 ng/mL

## 2020-12-15 MED ORDER — LACTATED RINGERS IV BOLUS: 1000.0000 mL | Freq: Once | INTRAVENOUS | Status: DC

## 2020-12-15 MED ORDER — TECHNETIUM TC 99M MEBROFENIN IV KIT
7.9500 | PACK | Freq: Once | INTRAVENOUS | Status: AC | PRN
  Administered 2020-12-15: 7.95 via INTRAVENOUS

## 2020-12-15 MED ORDER — PIPERACILLIN-TAZOBACTAM 3.375 G IVPB
3.3750 g | Freq: Three times a day (TID) | INTRAVENOUS | Status: DC
  Administered 2020-12-15 – 2020-12-20 (×15): 3.375 g via INTRAVENOUS
  Filled 2020-12-15 (×14): qty 50

## 2020-12-15 NOTE — Progress Notes (Signed)
Temp spike yesterday AM to  102, better since.   Persistent, mild tachycardia. Passing flatus, but no BM. Reports decreased gas since yesterday's CT. Brought up sputum x 1 after Neb treatment. Feels breathing is better. Describes a burning pain in abdomen in the abdomen when at rest, better when ambulating. Finds it easier to get in and out of bed.  Labs: WBC continues to rise, but platelets stable, normal.  Gradual slide in HGB from 15.3 on initial presentation 2 weeks ago to 13.7 today. LFT's: Continued improvement in transaminases, alk phos normal x 2 days.  Biliruibin drifing up with increased direct and indirect component.  Lipase: Normal.  CXR yesterday looked better than CT findings.  Exam: Fatigued. Lungs: Clear. Cardio: Tachy,but RR.  ABD: Modest distension, no focal tenderness. BS present, mildly decreased.  I thought the fluid noted on yesterday's CT was likely residual from initial leak, as majority was in the pelvis.  With present bili levels, while unlikely, need to consider ongoing leak from the cystic duct.   Will recheck HIDA today.

## 2020-12-15 NOTE — Progress Notes (Signed)
Pharmacy Antibiotic Note  Nicholas Gallagher is a 40 y.o. male who underwent laparoscopic cholecystectomy 5/25 and he was discharged around 5 pm. Pt came back to ED complaining of abdominal pain - severe pain mostly in his right upper quadrant. Pt had been on ciprofloxacin since 5/26. Pharmacy has been consulted for Zosyn dosing for sepsis. WBC 32.2, pt also had fever of 102F  5/28 CT abdomen: non organized gas and fluid collection in gallbladder fossa  5/29 US abdomen: pending  Plan: Start Zosyn 3.375g IV q8h (4 hour infusion) Follow up cultures and pt improvement   Height: 6\' 1"  (185.4 cm) Weight: 102.1 kg (225 lb 1.4 oz) IBW/kg (Calculated) : 79.9  Temp (24hrs), Avg:100.1 F (37.8 C), Min:98.4 F (36.9 C), Max:102 F (38.9 C)  Recent Labs  Lab 12/11/20 2354 12/13/20 0729 12/14/20 0544 12/15/20 0617  WBC 12.3* 18.7* 24.4* 32.2*  CREATININE 1.06  --  0.85 0.89    Estimated Creatinine Clearance: 137.2 mL/min (by C-G formula based on SCr of 0.89 mg/dL).    No Known Allergies  Antimicrobials this admission: 5/26 Ciprofloxacin >> 5/29 5/29 Zosyn >>   Dose adjustments this admission:   Microbiology results: 5/29 BCx: sent 5/28 MRSA PCR: negative  Thank you for allowing pharmacy to be a part of this patient's care.  6/28, PharmD Pharmacy Resident  12/15/2020 12:23 PM

## 2020-12-15 NOTE — Progress Notes (Signed)
Pulmonary Medicine          Date: 12/15/2020,   MRN# [id number] Nicholas Gallagher April 17, 1981     AdmissionWeight: 102.1 kg                 CurrentWeight: 102.1 kg   Referring physician: Dr Lemar Livings   CHIEF COMPLAINT:   Febrile illness with abnormal chest imaging   HISTORY OF PRESENT ILLNESS    This 40 year old male s/p lap chole 5/25, he does not have baseline medical problems and generally does not take medication. He had abd discomfort with flatus post operatively.  He has developed some pain and noted to have shallow breathing.   He was noted to have fever >102 and tachycardia.  He had  CT imaging done which I reviewed personally.   Labwork significant for leukocytosis and transaminitis, with improved elevation of tbili.  He is urinating well and BP is stable.     12/15/20- patient with trending up leukocytosis. Had HIDA today.  S/p lasix with diuresis.  He received metaneb and atelectasis appears improved upon review of interval chest x rays.  Hepating function panel is improved, lipase is trending down. He is ambulating well on his own. He was febrile overnight Tmax >102 and required Oxygen 2L/min [place].  Patient shares he feels better today.   PAST MEDICAL HISTORY   Past Medical History:  Diagnosis Date  . Gallstones      SURGICAL HISTORY   Past Surgical History:  Procedure Laterality Date  . CHOLECYSTECTOMY N/A 12/11/2020   Procedure: LAPAROSCOPIC CHOLECYSTECTOMY WITH INTRAOPERATIVE CHOLANGIOGRAM;  Surgeon: Earline Mayotte, MD;  Location: [facility] ORS;  Service: General;  Laterality: N/A;  . ERCP N/A 12/12/2020   Procedure: ENDOSCOPIC RETROGRADE CHOLANGIOPANCREATOGRAPHY (ERCP);  Surgeon: Midge Minium, MD;  Location: [facility] ENDOSCOPY;  Service: Endoscopy;  Laterality: N/A;  . NO PAST SURGERIES       FAMILY HISTORY   History reviewed. No pertinent family history.   SOCIAL HISTORY   Social History   Tobacco Use  . Smoking status: Never Smoker  .  Smokeless tobacco: Never Used  Vaping Use  . Vaping Use: Never used  Substance Use Topics  . Alcohol use: Yes    Comment: occassional  . Drug use: Never     MEDICATIONS    Home Medication:    Current Medication:  Current Facility-Administered Medications:  .  acetaminophen (TYLENOL) tablet 650 mg, 650 mg, Oral, Q4H while awake, Earline Mayotte, MD, 650 mg at 12/15/20 0826 .  ciprofloxacin (CIPRO) IVPB 400 mg, 400 mg, Intravenous, Q12H, [name], Merrily Pew, MD, Last Rate: 200 mL/hr at 12/15/20 0331, 400 mg at 12/15/20 0331 .  enoxaparin (LOVENOX) injection 40 mg, 40 mg, Subcutaneous, Q24H, [name], Merrily Pew, MD, 40 mg at 12/15/20 0827 .  HYDROmorphone (DILAUDID) injection 1 mg, 1 mg, Intravenous, Q3H PRN, Earline Mayotte, MD, 1 mg at 12/15/20 1048 .  lactated ringers bolus 1,000 mL, 1,000 mL, Intravenous, Once, Sakai, Isami, DO .  lactated ringers infusion, , Intravenous, Continuous, Sakai, Isami, DO, Last Rate: 10 mL/hr at 12/14/20 1205, Rate Change at 12/14/20 1205 .  metoCLOPramide (REGLAN) tablet 10 mg, 10 mg, Oral, TID AC & HS, [name], Merrily Pew, MD, 10 mg at 12/15/20 0826 .  ondansetron (ZOFRAN) injection 4 mg, 4 mg, Intravenous, Q4H PRN, Earline Mayotte, MD, 4 mg at 12/14/20 1858 .  oxyCODONE (Oxy IR/ROXICODONE) immediate release tablet 10 mg, 10 mg, Oral, Q4H PRN, [name], Merrily Pew, MD,  10 mg at 12/14/20 1740    ALLERGIES   Patient has no known allergies.     REVIEW OF SYSTEMS    Review of Systems:  Gen:  Denies  fever, sweats, chills weigh loss  HEENT: Denies blurred vision, double vision, ear pain, eye pain, hearing loss, nose bleeds, sore throat Cardiac:  No dizziness, chest pain or heaviness, chest tightness,edema Resp:   Denies cough or sputum porduction, shortness of breath,wheezing, hemoptysis,  Gi: Denies swallowing difficulty, stomach pain, nausea or vomiting, diarrhea, constipation, bowel incontinence Gu:  Denies bladder incontinence, burning  urine Ext:   Denies Joint pain, stiffness or swelling Skin: Denies  skin rash, easy bruising or bleeding or hives Endoc:  Denies polyuria, polydipsia , polyphagia or weight change Psych:   Denies depression, insomnia or hallucinations   Other:  All other systems negative   VS: BP (!) 158/98 (BP Location: Left Arm) Comment: RN Alvy BealLakisha notified  Pulse (!) 109 Comment: RN BorgWarnerLakisha notified  Temp (!) 102 F (38.9 C) Comment: RN Lakisha notified  Resp 20   Ht 6\' 1"  (1.854 m)   Wt 102.1 kg   SpO2 92%   BMI 29.70 kg/m      PHYSICAL EXAM    GENERAL:NAD, no fevers, chills, no weakness no fatigue HEAD: Normocephalic, atraumatic.  EYES: Pupils equal, round, reactive to light. Extraocular muscles intact. No scleral icterus.  MOUTH: Moist mucosal membrane. Dentition intact. No abscess noted.  EAR, NOSE, THROAT: Clear without exudates. No external lesions.  NECK: Supple. No thyromegaly. No nodules. No JVD.  PULMONARY: decreased bs at bases bilaterally . CARDIOVASCULAR: S1 and S2. Regular rate and rhythm. No murmurs, rubs, or gallops. No edema. Pedal pulses 2+ bilaterally.  GASTROINTESTINAL: +abd tenderness MUSCULOSKELETAL: No swelling, clubbing, or edema. Range of motion full in all extremities.  NEUROLOGIC: Cranial nerves II through XII are intact. No gross focal neurological deficits. Sensation intact. Reflexes intact.  SKIN: No ulceration, lesions, rashes, or cyanosis. Skin warm and dry. Turgor intact.  PSYCHIATRIC: Mood, affect within normal limits. The patient is awake, alert and oriented x 3. Insight, judgment intact.       IMAGING    CT ABDOMEN PELVIS WO CONTRAST  Result Date: 12/12/2020 CLINICAL DATA:  Abdominal distension. Gallbladder removed today with worsening abdominal pain and right neck pain. Postop day 0 EXAM: CT ABDOMEN AND PELVIS WITHOUT CONTRAST TECHNIQUE: Multidetector CT imaging of the abdomen and pelvis was performed following the standard protocol without IV  contrast. COMPARISON:  Ultrasound abdomen 12/01/2020 FINDINGS: Lower chest: Bilateral lower lobe subsegmental atelectasis/consolidations, right greater than left. Hepatobiliary: No focal liver abnormality. Status post cholecystectomy. Trace fluid along the gallbladder fossa likely postsurgical. No biliary dilatation. Pancreas: No focal lesion. Normal pancreatic contour. No surrounding inflammatory changes. No main pancreatic ductal dilatation. Spleen: Normal in size without focal abnormality. Adrenals/Urinary Tract: No adrenal nodule bilaterally. No nephrolithiasis, no hydronephrosis, and no contour-deforming renal mass. No ureterolithiasis or hydroureter. The urinary bladder is unremarkable. Stomach/Bowel: Stomach is within normal limits. No evidence of bowel wall thickening or dilatation. Appendix appears normal. Vascular/Lymphatic: No significant vascular findings are present. No enlarged abdominal or pelvic lymph nodes. Reproductive: Prostate is unremarkable. Other: Trace perihepatic/infra diaphragmatic free fluid and foci of gas. Trace free fluid within the pelvis. No organized fluid collection. Musculoskeletal: No abdominal wall hernia or abnormality. No suspicious lytic or blastic osseous lesions. No acute displaced fracture. IMPRESSION: 1. Bilateral lower lobe consolidations, right greater than left, likely represent atelectasis in the postsurgical setting peer  2. Trace perihepatic/infra-diaphragmatic free fluid and foci of gas likely postsurgical in etiology in the setting of post cholecystectomy. Electronically Signed   By: Tish Frederickson M.D.   On: 12/12/2020 00:42   CT ABDOMEN PELVIS W CONTRAST  Result Date: 12/14/2020 CLINICAL DATA:  Acute, severe pancreatitis EXAM: CT ABDOMEN AND PELVIS WITH CONTRAST TECHNIQUE: Multidetector CT imaging of the abdomen and pelvis was performed using the standard protocol following bolus administration of intravenous contrast. CONTRAST:  OMNIPAQUE IOHEXOL 300  MG/ML  SOLN COMPARISON:  Abdominal MRI 12/12/2020 FINDINGS: Lower chest: Multi segment atelectasis at the lung bases with small pleural effusions. Hepatobiliary: No focal liver abnormality.Recent cholecystectomy. Gas and fluid collection without defined wall in the gallbladder fossa, 4.4 x 2.3 cm on axial slices. There is a stent in the CBD without ductal dilatation or malpositioning. A retention loop is seen at the level of the duodenum. Pancreas: Peripancreatic edema and generalized mild pancreas expansion. No necrosis or pancreatic collection. No pancreatic ductal dilatation. Spleen: Unremarkable. Adrenals/Urinary Tract: Negative adrenals. No hydronephrosis or stone. Unremarkable bladder. Stomach/Bowel:  No obstruction. No appendicitis. Vascular/Lymphatic: No acute vascular abnormality. No mass or adenopathy. Reproductive:No pathologic findings. Other: No ascites or pneumoperitoneum.  Small volume ascites Musculoskeletal: No acute abnormalities. IMPRESSION: 1. Non organized gas and fluid collection in the gallbladder fossa measuring up to 4.4 x 2.3 cm. 2. Acute edematous pancreatitis. The CBD stent is in good position with no ductal dilatation. 3. Multi segment atelectasis with small pleural effusions. Small volume non loculated ascites. Electronically Signed   By: Marnee Spring M.D.   On: 12/14/2020 10:30   DG Chest Port 1 View  Result Date: 12/15/2020 CLINICAL DATA:  40 year old male with recent cholecystectomy. EXAM: PORTABLE CHEST - 1 VIEW COMPARISON:  12/14/2020 FINDINGS: The mediastinal contours are within normal limits. No cardiomegaly. Low lung volumes. Similar appearing bibasilar subsegmental atelectasis. Trace left pleural effusion, unchanged. No pneumothorax. No acute osseous abnormality. IMPRESSION: Similar appearing bibasilar subsegmental atelectasis and trace left pleural effusion. Electronically Signed   By: Marliss Coots MD   On: 12/15/2020 09:38   DG Chest Port 1 View  Result Date:  12/14/2020 CLINICAL DATA:  Follow-up EXAM: PORTABLE CHEST 1 VIEW COMPARISON:  Dec 12, 2020 FINDINGS: The cardiomediastinal silhouette is unchanged in contour.Low lung volumes. Trace bilateral pleural effusion. No pneumothorax. Bibasilar linear opacities, consistent with atelectasis. These are mildly increased in comparison to prior. Visualized abdomen is unremarkable. No acute osseous abnormality. IMPRESSION: Bibasilar linear opacities, consistent with atelectasis. These are mildly increased in comparison to prior. Electronically Signed   By: Meda Klinefelter MD   On: 12/14/2020 15:12   DG Chest Portable 1 View  Result Date: 12/12/2020 CLINICAL DATA:  Short of breath. Pt had gall bladder removed today here for worsening abd pain and right neck pain. EXAM: PORTABLE CHEST 1 VIEW COMPARISON:  None. FINDINGS: Prominent cardiac silhouette likely due to AP portable technique as well as low lung volumes. The heart size and mediastinal contours are within normal limits. Low lung volumes with bibasilar atelectasis. No pulmonary edema. No pleural effusion. No pneumothorax. No acute osseous abnormality. IMPRESSION: Low lung volumes with bibasilar atelectasis. Electronically Signed   By: Tish Frederickson M.D.   On: 12/12/2020 00:43   DG C-Arm 1-60 Min-No Report  Result Date: 12/12/2020 Fluoroscopy was utilized by the requesting physician.  No radiographic interpretation.   MR ABDOMEN MRCP W WO CONTAST  Result Date: 12/12/2020 CLINICAL DATA:  Abdominal pain, suspected biliary duct obstruction. Post cholecystectomy.  EXAM: MRI ABDOMEN WITH AND WITHOUT CONTRAST (WITH MRCP) TECHNIQUE: Multiplanar multisequence MR imaging of the abdomen was performed prior to and following the administration of intravenous contrast. Heavily T2-weighted images of the biliary and pancreatic ducts were obtained, and three-dimensional MRCP images were rendered by post processing. CONTRAST:  63mL GADAVIST GADOBUTROL 1 MMOL/ML IV SOLN  COMPARISON:  CT abdomen and pelvis of Dec 12, 2020. FINDINGS: Lower chest: Dense basilar airspace disease at the RIGHT lung base with similar appearance to prior imaging obtained on the same date. Also with LEFT lower lobe airspace disease. Hepatobiliary: Perihepatic ascites. Small amount of fluid in the gallbladder fossa. The common bile duct is of normal caliber. No sign of filling defect in the biliary tree. No focal, suspicious hepatic lesion. Portal vein is patent. Hepatic veins are patent. On MRCP sequences fluid can be seen tracking from the gallbladder fossa incontinuity with small to moderate volume of perihepatic fluid. There is also interloop fluid in the upper abdomen adjacent to the RIGHT colon. This is best seen on T2 weighted imaging on image 37 of series 40 with respect to interloop fluid. Pancreas:  Normal, without mass, inflammation or ductal dilatation. Spleen:  Normal spleen. Adrenals/Urinary Tract: Adrenal glands are normal. Symmetric renal enhancement. No hydronephrosis. Stomach/Bowel: No sign of acute gastrointestinal process to the extent evaluated on abdominal MRI. Vascular/Lymphatic: Patent abdominal vessels. There is no gastrohepatic or hepatoduodenal ligament lymphadenopathy. No retroperitoneal or mesenteric lymphadenopathy. Other: Perihepatic ascites and fluid tracking into the RIGHT upper quadrant in communication with fluid about the gallbladder fossa. Small amount of pneumoperitoneum as on the previous CT anterior to the liver margin. Musculoskeletal: No suspicious bone lesions identified. IMPRESSION: Small to moderate volume of perihepatic ascites slightly increased since the previous study. There is also some fluid and or small amount of blood inferior to the RIGHT hemi liver. While constellation of findings could be seen in the postoperative setting the slight interval increase in fluid about the liver, in communication with the gallbladder fossa in the setting of increasing  bilirubin raises suspicion for biliary leak. Nuclear medicine hepatobiliary scan may be helpful to exclude the possibility of biliary leak. Dense basilar consolidative changes RIGHT greater than LEFT persist likely related to volume loss, difficult to exclude the possibility of developing infection, attention on follow-up. These results will be called to the ordering clinician or representative by the Radiologist Assistant, and communication documented in the PACS or [employer]. Electronically Signed   By: Donzetta Kohut M.D.   On: 12/12/2020 11:41   NM HEPATOBILIARY LEAK (POST-SURGICAL)  Result Date: 12/12/2020 CLINICAL DATA:  Status post cholecystectomy. Perihepatic fluid on recent MRI. Evaluate for postop bile leak. EXAM: NUCLEAR MEDICINE HEPATOBILIARY IMAGING TECHNIQUE: Sequential images of the abdomen were obtained out to 60 minutes following intravenous administration of radiopharmaceutical. RADIOPHARMACEUTICALS:  7.4 mCi Tc-9m  Choletec IV COMPARISON:  None. FINDINGS: Prompt uptake and biliary excretion of activity by the liver is seen. Patient is status post cholecystectomy. Accumulation of biliary activity is seen in the gallbladder fossa, which then shows progressive extension into the perihepatic spaces, consistent with postop bile leak. No biliary activity reaches the small bowel, however patency of the common bile duct cannot be determined due to the large bile leak. IMPRESSION: Large post-op bile leak. No biliary activity reaches small bowel, however common bile duct patency cannot be determined due to the large bowel loop. These results will be called to the ordering clinician or representative by the Radiologist Assistant, and communication documented  in the PACS or [employer]. Electronically Signed   By: Danae Orleans M.D.   On: 12/12/2020 15:20   US Abdomen Limited RUQ (LIVER/GB)  Result Date: 12/01/2020 CLINICAL DATA:  Epigastric pain EXAM: ULTRASOUND ABDOMEN LIMITED RIGHT  UPPER QUADRANT COMPARISON:  None. FINDINGS: Gallbladder: Cholelithiasis including an 8 mm stone at the neck. No gallbladder wall thickening but there is focal tenderness by sonographer exam. No pericholecystic fluid. Common bile duct: Diameter: 5 mm. Liver: Echogenic liver with diminished acoustic penetration. Portal vein is patent on color Doppler imaging with normal direction of blood flow towards the liver. Other: None. IMPRESSION: 1. Cholelithiasis including stone at the gallbladder neck. There is focal tenderness which could reflect gallbladder obstruction, but no wall thickening or edema typical of acute cholecystitis. 2. Hepatic steatosis. Electronically Signed   By: Marnee Spring M.D.   On: 12/01/2020 05:46      ASSESSMENT/PLAN    Acute febrile illness with hypoxemia -bibasilar atelectasis cannot rule out consolidation/infiltrate -on cipro will broaden to zosyn due to persistent fevers and trending up leucocytosis -respiratory infection workup thus far is negative, blood cultures in process.  -s/p HIDA -[place] abd complete in process- checking for nephrolithiasis -MRSA pcr-negative -REsp viral panel-negative - COVID19 negative - supplemental O2 during my evaluation 2L/min -legionella ab -strep pneumoniae ur AG-negative -sputum resp cultures -reviewed pertinent imaging with patient today -PT/OT for d/c planning  -US renal - mild ascites, GB surgical changes noted -LDH elevation - query due to pancreatitis/physical stress due to surgery    Acute Pancreatitis   - GB related   - note s/p ERCP -resolving - patient eating  -there is pleural effusion and atelectasis with abd asictes - patient on IVF    Bibasilar atelectasis  - Incentive spirometer - patient takes tidal volume 1500cc   - patient will need MetaNEB with saline for recruitment      Thank you for allowing me to participate in the care of this patient.   Patient/Family are satisfied with care plan and all  questions have been answered.  This document was prepared using Dragon voice recognition software and may include unintentional dictation errors.     Vida Rigger, M.D.  Division of Pulmonary & Critical Care Medicine  [facility]

## 2020-12-16 LAB — COMPREHENSIVE METABOLIC PANEL
ALT: 125 U/L — ABNORMAL HIGH (ref 0–44)
AST: 22 U/L (ref 15–41)
Albumin: 2.8 g/dL — ABNORMAL LOW (ref 3.5–5.0)
Alkaline Phosphatase: 117 U/L (ref 38–126)
Anion gap: 11 (ref 5–15)
BUN: 9 mg/dL (ref 6–20)
CO2: 26 mmol/L (ref 22–32)
Calcium: 8.2 mg/dL — ABNORMAL LOW (ref 8.9–10.3)
Chloride: 96 mmol/L — ABNORMAL LOW (ref 98–111)
Creatinine, Ser: 0.73 mg/dL (ref 0.61–1.24)
GFR, Estimated: >60 mL/min (ref >60)
Glucose, Bld: 121 mg/dL — ABNORMAL HIGH (ref 70–99)
Potassium: 3.5 mmol/L (ref 3.5–5.1)
Sodium: 133 mmol/L — ABNORMAL LOW (ref 135–145)
Total Bilirubin: 1.7 mg/dL — ABNORMAL HIGH (ref 0.3–1.2)
Total Protein: 6.6 g/dL (ref 6.5–8.1)

## 2020-12-16 LAB — CBC WITH DIFFERENTIAL/PLATELET
Abs Immature Granulocytes: 0.25 10*3/uL — ABNORMAL HIGH (ref 0.00–0.07)
Basophils Absolute: 0.1 10*3/uL (ref 0.0–0.1)
Basophils Relative: 0 %
Eosinophils Absolute: 0 10*3/uL (ref 0.0–0.5)
Eosinophils Relative: 0 %
HCT: 35.3 % — ABNORMAL LOW (ref 39.0–52.0)
Hemoglobin: 12.1 g/dL — ABNORMAL LOW (ref 13.0–17.0)
Immature Granulocytes: 1 %
Lymphocytes Relative: 4 %
Lymphs Abs: 1.1 10*3/uL (ref 0.7–4.0)
MCH: 30.2 pg (ref 26.0–34.0)
MCHC: 34.3 g/dL (ref 30.0–36.0)
MCV: 88 fL (ref 80.0–100.0)
Monocytes Absolute: 2.4 10*3/uL — ABNORMAL HIGH (ref 0.1–1.0)
Monocytes Relative: 9 %
Neutro Abs: 23.5 10*3/uL — ABNORMAL HIGH (ref 1.7–7.7)
Neutrophils Relative %: 86 %
Platelets: 399 10*3/uL (ref 150–400)
RBC: 4.01 MIL/uL — ABNORMAL LOW (ref 4.22–5.81)
RDW: 12.5 % (ref 11.5–15.5)
Smear Review: NORMAL
WBC: 27.4 10*3/uL — ABNORMAL HIGH (ref 4.0–10.5)
nRBC: 0 % (ref 0.0–0.2)

## 2020-12-16 LAB — PROCALCITONIN: Procalcitonin: 1.05 ng/mL

## 2020-12-16 LAB — LEGIONELLA PNEUMOPHILA SEROGP 1 UR AG: L. pneumophila Serogp 1 Ur Ag: NEGATIVE

## 2020-12-16 LAB — AEROBIC/ANAEROBIC CULTURE W GRAM STAIN (SURGICAL/DEEP WOUND): Culture: NO GROWTH

## 2020-12-16 MED ORDER — POTASSIUM CHLORIDE IN NACL 40-0.9 MEQ/L-% IV SOLN
INTRAVENOUS | Status: DC
  Filled 2020-12-16 (×4): qty 1000

## 2020-12-16 MED ORDER — ACETAMINOPHEN 325 MG PO TABS
650.0000 mg | ORAL_TABLET | ORAL | Status: DC | PRN
  Administered 2020-12-16 – 2020-12-20 (×3): 650 mg via ORAL
  Filled 2020-12-16 (×3): qty 2

## 2020-12-16 MED ORDER — BISACODYL 10 MG RE SUPP
10.0000 mg | Freq: Every day | RECTAL | Status: DC | PRN
  Administered 2020-12-16: 10 mg via RECTAL
  Filled 2020-12-16: qty 1

## 2020-12-16 NOTE — Progress Notes (Signed)
Pulmonary Medicine          Date: 12/16/2020,   MRN# [id number] Nicholas BerryBrandon Gallagher [birth year]     AdmissionWeight: 102.1 kg                 CurrentWeight: 102.1 kg   Referring physician: Dr Lemar LivingsByrnett   CHIEF COMPLAINT:   Febrile illness with abnormal chest imaging   HISTORY OF PRESENT ILLNESS    This 40 year old male s/p lap chole 5/25, he does not have baseline medical problems and generally does not take medication. He had abd discomfort with flatus post operatively.  He has developed some pain and noted to have shallow breathing.   He was noted to have fever >102 and tachycardia.  He had  CT imaging done which I reviewed personally.   Labwork significant for leukocytosis and transaminitis, with improved elevation of tbili.  He is urinating well and BP is stable.     12/15/20- patient with trending up leukocytosis. Had HIDA today.  S/p lasix with diuresis.  He received metaneb and atelectasis appears improved upon review of interval chest x rays.  Hepating function panel is improved, lipase is trending down. He is ambulating well on his own. He was febrile overnight Tmax >102 and required Oxygen 2L/min [place].  Patient shares he feels better today.   12/16/20- patient reports "I turned the corner" able to lay in bed in supine position and sleep without pain. He is eating , no BM.  Blood work with further improved transaminitis and leucocytosis with improved numbers . PCT trending up.   PAST MEDICAL HISTORY   Past Medical History:  Diagnosis Date  . Gallstones      SURGICAL HISTORY   Past Surgical History:  Procedure Laterality Date  . CHOLECYSTECTOMY N/A 12/11/2020   Procedure: LAPAROSCOPIC CHOLECYSTECTOMY WITH INTRAOPERATIVE CHOLANGIOGRAM;  Surgeon: Earline MayotteByrnett, Jeffrey W, MD;  Location: [facility] ORS;  Service: General;  Laterality: N/A;  . ERCP N/A 12/12/2020   Procedure: ENDOSCOPIC RETROGRADE CHOLANGIOPANCREATOGRAPHY (ERCP);  Surgeon: Midge MiniumWohl, Darren, MD;  [facility];  Service: Endoscopy;  Laterality: N/A;  . NO PAST SURGERIES       FAMILY HISTORY   History reviewed. No pertinent family history.   SOCIAL HISTORY   Social History   Tobacco Use  . Smoking status: Never Smoker  . Smokeless tobacco: Never Used  Vaping Use  . Vaping Use: Never used  Substance Use Topics  . Alcohol use: Yes    Comment: occassional  . Drug use: Never     MEDICATIONS    Home Medication:    Current Medication:  Current Facility-Administered Medications:  .  0.9 % NaCl with KCl 40 mEq / L  infusion, , Intravenous, Continuous, [name], [name], MD, Last Rate: 50 mL/hr at 12/16/20 1001, New Bag at 12/16/20 1001 .  acetaminophen (TYLENOL) tablet 650 mg, 650 mg, Oral, Q4H PRN, Earline MayotteByrnett, Jeffrey W, MD .  enoxaparin (LOVENOX) injection 40 mg, 40 mg, Subcutaneous, Q24H, [name], [name], MD, 40 mg at 12/16/20 0857 .  HYDROmorphone (DILAUDID) injection 1 mg, 1 mg, Intravenous, Q3H PRN, Earline MayotteByrnett, Jeffrey W, MD, 1 mg at 12/16/20 0958 .  lactated ringers bolus 1,000 mL, 1,000 mL, Intravenous, Once, Sakai, Isami, DO .  metoCLOPramide (REGLAN) tablet 10 mg, 10 mg, Oral, TID AC & HS, [name], [name], MD, 10 mg at 12/16/20 0857 .  ondansetron (ZOFRAN) injection 4 mg, 4 mg, Intravenous, Q4H PRN, Earline MayotteByrnett, Jeffrey W, MD, 4 mg at 12/14/20 1858 .  oxyCODONE (Oxy IR/ROXICODONE) immediate release tablet 10 mg, 10 mg, Oral, Q4H PRN, Earline Mayotte, MD, 10 mg at 12/16/20 0857 .  piperacillin-tazobactam (ZOSYN) IVPB 3.375 g, 3.375 g, Intravenous, Q8H, Rauer, Robyne Peers, RPH, Last Rate: 12.5 mL/hr at 12/16/20 0626, 3.375 g at 12/16/20 1610    ALLERGIES   Patient has no known allergies.     REVIEW OF SYSTEMS    Review of Systems:  Gen:  Denies  fever, sweats, chills weigh loss  HEENT: Denies blurred vision, double vision, ear pain, eye pain, hearing loss, nose bleeds, sore throat Cardiac:  No dizziness, chest pain or heaviness, chest  tightness,edema Resp:   Denies cough or sputum porduction, shortness of breath,wheezing, hemoptysis,  Gi: +abd pain Gu:  Denies bladder incontinence, burning urine Ext:   Denies Joint pain, stiffness or swelling Skin: Denies  skin rash, easy bruising or bleeding or hives Endoc:  Denies polyuria, polydipsia , polyphagia or weight change Psych:   Denies depression, insomnia or hallucinations   Other:  All other systems negative   VS: BP (!) 149/101 (BP Location: Right Arm) Comment: Reported to RN Nurse.  Pulse (!) 106   Temp (!) 97.5 F (36.4 C) (Oral)   Resp 20   Ht  (1.854 m)   Wt 102.1 kg   SpO2 94%   BMI 29.70 kg/m      PHYSICAL EXAM    GENERAL:NAD, no fevers, chills, no weakness no fatigue HEAD: Normocephalic, atraumatic.  EYES: Pupils equal, round, reactive to light. Extraocular muscles intact. No scleral icterus.  MOUTH: Moist mucosal membrane. Dentition intact. No abscess noted.  EAR, NOSE, THROAT: Clear without exudates. No external lesions.  NECK: Supple. No thyromegaly. No nodules. No JVD.  PULMONARY: decreased bs at bases bilaterally . CARDIOVASCULAR: S1 and S2. Regular rate and rhythm. No murmurs, rubs, or gallops. No edema. Pedal pulses 2+ bilaterally.  GASTROINTESTINAL: +abd tenderness MUSCULOSKELETAL: No swelling, clubbing, or edema. Range of motion full in all extremities.  NEUROLOGIC: Cranial nerves II through XII are intact. No gross focal neurological deficits. Sensation intact. Reflexes intact.  SKIN: No ulceration, lesions, rashes, or cyanosis. Skin warm and dry. Turgor intact.  PSYCHIATRIC: Mood, affect within normal limits. The patient is awake, alert and oriented x 3. Insight, judgment intact.       IMAGING    CT ABDOMEN PELVIS WO CONTRAST  Result Date: 12/12/2020 CLINICAL DATA:  Abdominal distension. Gallbladder removed today with worsening abdominal pain and right neck pain. Postop day 0 EXAM: CT ABDOMEN AND PELVIS WITHOUT CONTRAST  TECHNIQUE: Multidetector CT imaging of the abdomen and pelvis was performed following the standard protocol without IV contrast. COMPARISON:  Ultrasound abdomen 12/01/2020 FINDINGS: Lower chest: Bilateral lower lobe subsegmental atelectasis/consolidations, right greater than left. Hepatobiliary: No focal liver abnormality. Status post cholecystectomy. Trace fluid along the gallbladder fossa likely postsurgical. No biliary dilatation. Pancreas: No focal lesion. Normal pancreatic contour. No surrounding inflammatory changes. No main pancreatic ductal dilatation. Spleen: Normal in size without focal abnormality. Adrenals/Urinary Tract: No adrenal nodule bilaterally. No nephrolithiasis, no hydronephrosis, and no contour-deforming renal mass. No ureterolithiasis or hydroureter. The urinary bladder is unremarkable. Stomach/Bowel: Stomach is within normal limits. No evidence of bowel wall thickening or dilatation. Appendix appears normal. Vascular/Lymphatic: No significant vascular findings are present. No enlarged abdominal or pelvic lymph nodes. Reproductive: Prostate is unremarkable. Other: Trace perihepatic/infra diaphragmatic free fluid and foci of gas. Trace free fluid within the pelvis. No organized fluid collection. Musculoskeletal: No abdominal wall hernia or  abnormality. No suspicious lytic or blastic osseous lesions. No acute displaced fracture. IMPRESSION: 1. Bilateral lower lobe consolidations, right greater than left, likely represent atelectasis in the postsurgical setting peer 2. Trace perihepatic/infra-diaphragmatic free fluid and foci of gas likely postsurgical in etiology in the setting of post cholecystectomy. Electronically Signed   By: Tish Frederickson M.D.   On: 12/12/2020 00:42   US Abdomen Complete  Result Date: 12/15/2020 CLINICAL DATA:  Status post cholecystectomy several days ago with persistent abdominal pain EXAM: ABDOMEN ULTRASOUND COMPLETE COMPARISON:  CT from 12/14/2020, a paddle biliary  scan from earlier in the same day. FINDINGS: Gallbladder: Gallbladder is been surgically removed. Air-fluid collection is noted within the gallbladder fossa measuring 4.6 x 2.3 x 2.2 cm. This is roughly similar to that seen on the prior CT examination and consistent with the post cholecystectomy state. Recent HIDA scan shows no evidence of biliary leak. Common bile duct: Diameter: 4 mm Liver: Increase in echogenicity consistent with fatty infiltration. No focal mass is noted. Portal vein is patent on color Doppler imaging with normal direction of blood flow towards the liver. IVC: No abnormality visualized. Pancreas: Not well visualized. Spleen: Size and appearance within normal limits. Right Kidney: Length: 11.6 cm. Echogenicity within normal limits. No mass or hydronephrosis visualized. Left Kidney: Length: 10.9 cm. Echogenicity within normal limits. No mass or hydronephrosis visualized. Abdominal aorta: No aneurysm visualized. Other findings: Mild ascites is noted similar to that seen on recent CT. IMPRESSION: Postoperative fluid collection in the gallbladder fossa similar to that seen on CT from the previous day. Recent HIDA scan shows no evidence of biliary leak. Mild ascites. No other focal abnormality is noted. Electronically Signed   By: Alcide Clever M.D.   On: 12/15/2020 15:39   CT ABDOMEN PELVIS W CONTRAST  Result Date: 12/14/2020 CLINICAL DATA:  Acute, severe pancreatitis EXAM: CT ABDOMEN AND PELVIS WITH CONTRAST TECHNIQUE: Multidetector CT imaging of the abdomen and pelvis was performed using the standard protocol following bolus administration of intravenous contrast. CONTRAST:  OMNIPAQUE IOHEXOL 300 MG/ML  SOLN COMPARISON:  Abdominal MRI 12/12/2020 FINDINGS: Lower chest: Multi segment atelectasis at the lung bases with small pleural effusions. Hepatobiliary: No focal liver abnormality.Recent cholecystectomy. Gas and fluid collection without defined wall in the gallbladder fossa, 4.4 x 2.3 cm  on axial slices. There is a stent in the CBD without ductal dilatation or malpositioning. A retention loop is seen at the level of the duodenum. Pancreas: Peripancreatic edema and generalized mild pancreas expansion. No necrosis or pancreatic collection. No pancreatic ductal dilatation. Spleen: Unremarkable. Adrenals/Urinary Tract: Negative adrenals. No hydronephrosis or stone. Unremarkable bladder. Stomach/Bowel:  No obstruction. No appendicitis. Vascular/Lymphatic: No acute vascular abnormality. No mass or adenopathy. Reproductive:No pathologic findings. Other: No ascites or pneumoperitoneum.  Small volume ascites Musculoskeletal: No acute abnormalities. IMPRESSION: 1. Non organized gas and fluid collection in the gallbladder fossa measuring up to 4.4 x 2.3 cm. 2. Acute edematous pancreatitis. The CBD stent is in good position with no ductal dilatation. 3. Multi segment atelectasis with small pleural effusions. Small volume non loculated ascites. Electronically Signed   By: Marnee Spring M.D.   On: 12/14/2020 10:30   DG Chest Port 1 View  Result Date: 12/15/2020 CLINICAL DATA:  40 year old male with recent cholecystectomy. EXAM: PORTABLE CHEST - 1 VIEW COMPARISON:  12/14/2020 FINDINGS: The mediastinal contours are within normal limits. No cardiomegaly. Low lung volumes. Similar appearing bibasilar subsegmental atelectasis. Trace left pleural effusion, unchanged. No pneumothorax. No acute osseous abnormality.  IMPRESSION: Similar appearing bibasilar subsegmental atelectasis and trace left pleural effusion. Electronically Signed   By: Marliss Coots MD   On: 12/15/2020 09:38   DG Chest Port 1 View  Result Date: 12/14/2020 CLINICAL DATA:  Follow-up EXAM: PORTABLE CHEST 1 VIEW COMPARISON:  Dec 12, 2020 FINDINGS: The cardiomediastinal silhouette is unchanged in contour.Low lung volumes. Trace bilateral pleural effusion. No pneumothorax. Bibasilar linear opacities, consistent with atelectasis. These are mildly  increased in comparison to prior. Visualized abdomen is unremarkable. No acute osseous abnormality. IMPRESSION: Bibasilar linear opacities, consistent with atelectasis. These are mildly increased in comparison to prior. Electronically Signed   By: Meda Klinefelter MD   On: 12/14/2020 15:12   DG Chest Portable 1 View  Result Date: 12/12/2020 CLINICAL DATA:  Short of breath. Pt had gall bladder removed today here for worsening abd pain and right neck pain. EXAM: PORTABLE CHEST 1 VIEW COMPARISON:  None. FINDINGS: Prominent cardiac silhouette likely due to AP portable technique as well as low lung volumes. The heart size and mediastinal contours are within normal limits. Low lung volumes with bibasilar atelectasis. No pulmonary edema. No pleural effusion. No pneumothorax. No acute osseous abnormality. IMPRESSION: Low lung volumes with bibasilar atelectasis. Electronically Signed   By: Tish Frederickson M.D.   On: 12/12/2020 00:43   DG C-Arm 1-60 Min-No Report  Result Date: 12/12/2020 Fluoroscopy was utilized by the requesting physician.  No radiographic interpretation.   MR ABDOMEN MRCP W WO CONTAST  Result Date: 12/12/2020 CLINICAL DATA:  Abdominal pain, suspected biliary duct obstruction. Post cholecystectomy. EXAM: MRI ABDOMEN WITH AND WITHOUT CONTRAST (WITH MRCP) TECHNIQUE: Multiplanar multisequence MR imaging of the abdomen was performed prior to and following the administration of intravenous contrast. Heavily T2-weighted images of the biliary and pancreatic ducts were obtained, and three-dimensional MRCP images were rendered by post processing. CONTRAST:  10mL GADAVIST GADOBUTROL 1 MMOL/ML IV SOLN COMPARISON:  CT abdomen and pelvis of Dec 12, 2020. FINDINGS: Lower chest: Dense basilar airspace disease at the RIGHT lung base with similar appearance to prior imaging obtained on the same date. Also with LEFT lower lobe airspace disease. Hepatobiliary: Perihepatic ascites. Small amount of fluid in the  gallbladder fossa. The common bile duct is of normal caliber. No sign of filling defect in the biliary tree. No focal, suspicious hepatic lesion. Portal vein is patent. Hepatic veins are patent. On MRCP sequences fluid can be seen tracking from the gallbladder fossa incontinuity with small to moderate volume of perihepatic fluid. There is also interloop fluid in the upper abdomen adjacent to the RIGHT colon. This is best seen on T2 weighted imaging on image 37 of series 40 with respect to interloop fluid. Pancreas:  Normal, without mass, inflammation or ductal dilatation. Spleen:  Normal spleen. Adrenals/Urinary Tract: Adrenal glands are normal. Symmetric renal enhancement. No hydronephrosis. Stomach/Bowel: No sign of acute gastrointestinal process to the extent evaluated on abdominal MRI. Vascular/Lymphatic: Patent abdominal vessels. There is no gastrohepatic or hepatoduodenal ligament lymphadenopathy. No retroperitoneal or mesenteric lymphadenopathy. Other: Perihepatic ascites and fluid tracking into the RIGHT upper quadrant in communication with fluid about the gallbladder fossa. Small amount of pneumoperitoneum as on the previous CT anterior to the liver margin. Musculoskeletal: No suspicious bone lesions identified. IMPRESSION: Small to moderate volume of perihepatic ascites slightly increased since the previous study. There is also some fluid and or small amount of blood inferior to the RIGHT hemi liver. While constellation of findings could be seen in the postoperative setting the slight interval  increase in fluid about the liver, in communication with the gallbladder fossa in the setting of increasing bilirubin raises suspicion for biliary leak. Nuclear medicine hepatobiliary scan may be helpful to exclude the possibility of biliary leak. Dense basilar consolidative changes RIGHT greater than LEFT persist likely related to volume loss, difficult to exclude the possibility of developing infection, attention  on follow-up. These results will be called to the ordering clinician or representative by the Radiologist Assistant, and communication documented in the PACS or [employer]. Electronically Signed   By: Donzetta Kohut M.D.   On: 12/12/2020 11:41   NM HEPATOBILIARY LEAK (POST-SURGICAL)  Result Date: 12/15/2020 CLINICAL DATA:  Status post cholecystectomy, complicated by bile leak, status post ERCP stent placement EXAM: NUCLEAR MEDICINE HEPATOBILIARY IMAGING TECHNIQUE: Sequential images of the abdomen were obtained out to 60 minutes following intravenous administration of radiopharmaceutical. RADIOPHARMACEUTICALS:  7.95 mCi Tc-10m  Choletec IV COMPARISON:  12/12/2020 FINDINGS: Prompt uptake and biliary excretion of activity by the liver is seen. Gallbladder is surgically absent. Biliary activity passes into small bowel, consistent with patent common bile duct. No evidence of extraluminal radiotracer accumulation. IMPRESSION: 1.  Negative examination for bile leak status post cholecystectomy. 2.  The common bile duct is patent. Electronically Signed   By: Lauralyn Primes M.D.   On: 12/15/2020 12:29   NM HEPATOBILIARY LEAK (POST-SURGICAL)  Result Date: 12/12/2020 CLINICAL DATA:  Status post cholecystectomy. Perihepatic fluid on recent MRI. Evaluate for postop bile leak. EXAM: NUCLEAR MEDICINE HEPATOBILIARY IMAGING TECHNIQUE: Sequential images of the abdomen were obtained out to 60 minutes following intravenous administration of radiopharmaceutical. RADIOPHARMACEUTICALS:  7.4 mCi Tc-95m  Choletec IV COMPARISON:  None. FINDINGS: Prompt uptake and biliary excretion of activity by the liver is seen. Patient is status post cholecystectomy. Accumulation of biliary activity is seen in the gallbladder fossa, which then shows progressive extension into the perihepatic spaces, consistent with postop bile leak. No biliary activity reaches the small bowel, however patency of the common bile duct cannot be determined due to  the large bile leak. IMPRESSION: Large post-op bile leak. No biliary activity reaches small bowel, however common bile duct patency cannot be determined due to the large bowel loop. These results will be called to the ordering clinician or representative by the Radiologist Assistant, and communication documented in the PACS or [employer]. Electronically Signed   By: Danae Orleans M.D.   On: 12/12/2020 15:20   US Abdomen Limited RUQ (LIVER/GB)  Result Date: 12/01/2020 CLINICAL DATA:  Epigastric pain EXAM: ULTRASOUND ABDOMEN LIMITED RIGHT UPPER QUADRANT COMPARISON:  None. FINDINGS: Gallbladder: Cholelithiasis including an 8 mm stone at the neck. No gallbladder wall thickening but there is focal tenderness by sonographer exam. No pericholecystic fluid. Common bile duct: Diameter: 5 mm. Liver: Echogenic liver with diminished acoustic penetration. Portal vein is patent on color Doppler imaging with normal direction of blood flow towards the liver. Other: None. IMPRESSION: 1. Cholelithiasis including stone at the gallbladder neck. There is focal tenderness which could reflect gallbladder obstruction, but no wall thickening or edema typical of acute cholecystitis. 2. Hepatic steatosis. Electronically Signed   By: Marnee Spring M.D.   On: 12/01/2020 05:46      ASSESSMENT/PLAN    Acute febrile illness with hypoxemia -bibasilar atelectasis cannot rule out consolidation/infiltrate -on cipro will broaden to zosyn due to persistent fevers and trending up leucocytosis -respiratory infection workup thus far is negative, blood cultures in process.  -s/p HIDA -[place] abd complete in process- checking for nephrolithiasis -  MRSA pcr-negative -REsp viral panel-negative - COVID19 negative - supplemental O2 during my evaluation 2L/min -legionella ab -strep pneumoniae ur AG-negative -sputum resp cultures -reviewed pertinent imaging with patient today -PT/OT for d/c planning  -US renal - mild ascites, GB  surgical changes noted -LDH elevation - query due to pancreatitis/physical stress due to surgery    Acute Pancreatitis   - GB related   - note s/p ERCP -resolving - patient eating  -there is pleural effusion and atelectasis with abd asictes - patient on IVF -50/hr -LDH elevated   Bibasilar atelectasis  - Incentive spirometer - patient takes tidal volume 1500cc   - patient will need MetaNEB with saline for recruitment      Thank you for allowing me to participate in the care of this patient.   Patient/Family are satisfied with care plan and all questions have been answered.  This document was prepared using Dragon voice recognition software and may include unintentional dictation errors.     Vida Rigger, M.D.  Division of Pulmonary & Critical Care Medicine  [facility]

## 2020-12-16 NOTE — Progress Notes (Signed)
Temp markedly improved. Pain intensity better, still a "burning" sensation, but much less severe on routine narcotic regimen. Ambulating well. Deep breathing better. Reports IV fluids helped with abdominal discomfort yesterday. Passing flatus.  Persistent mild tachycardia. Sats markedlly improved.   Lungs: Clear. Cardio: RR. ABD: Moderate distension. Softer. No focal tenderness. Extrem: Soft.  HIDA yesterday neg for biliary leak.  U/S showed only a small fluid collection in GB fossa (anticipated with acute inflammation noted at the time of surgery.  Labs: WBC down from 32 to 27 w/ change Zosyn. HGB down w/ hydration to 12.1.  Platelets normal.  LFT's, including bili continuing to trend down. Mild hyponatremia, K+ 3.5.   BS running 120's.  IMP:  Looks much better today.  Plan: Analgesics at present schedule today, wean tomorrow. Change to NS.  Trial clear liquids.

## 2020-12-17 ENCOUNTER — Inpatient Hospital Stay: Payer: BC Managed Care – PPO

## 2020-12-17 LAB — PROCALCITONIN: Procalcitonin: 1.48 ng/mL

## 2020-12-17 LAB — CBC WITH DIFFERENTIAL/PLATELET
Abs Immature Granulocytes: 0.62 10*3/uL — ABNORMAL HIGH (ref 0.00–0.07)
Basophils Absolute: 0.1 10*3/uL (ref 0.0–0.1)
Basophils Relative: 0 %
Eosinophils Absolute: 0.1 10*3/uL (ref 0.0–0.5)
Eosinophils Relative: 0 %
HCT: 34.8 % — ABNORMAL LOW (ref 39.0–52.0)
Hemoglobin: 11.9 g/dL — ABNORMAL LOW (ref 13.0–17.0)
Immature Granulocytes: 2 %
Lymphocytes Relative: 7 %
Lymphs Abs: 1.9 10*3/uL (ref 0.7–4.0)
MCH: 30.4 pg (ref 26.0–34.0)
MCHC: 34.2 g/dL (ref 30.0–36.0)
MCV: 89 fL (ref 80.0–100.0)
Monocytes Absolute: 3.4 10*3/uL — ABNORMAL HIGH (ref 0.1–1.0)
Monocytes Relative: 12 %
Neutro Abs: 22.7 10*3/uL — ABNORMAL HIGH (ref 1.7–7.7)
Neutrophils Relative %: 79 %
Platelets: 436 10*3/uL — ABNORMAL HIGH (ref 150–400)
RBC: 3.91 MIL/uL — ABNORMAL LOW (ref 4.22–5.81)
RDW: 12.8 % (ref 11.5–15.5)
Smear Review: NORMAL
WBC: 28.9 10*3/uL — ABNORMAL HIGH (ref 4.0–10.5)
nRBC: 0 % (ref 0.0–0.2)

## 2020-12-17 LAB — CALCIUM, IONIZED: Calcium, Ionized, Serum: 4.9 mg/dL (ref 4.5–5.6)

## 2020-12-17 LAB — LIPASE, BLOOD: Lipase: 22 U/L (ref 11–51)

## 2020-12-17 MED ORDER — KETOROLAC TROMETHAMINE 30 MG/ML IJ SOLN
30.0000 mg | Freq: Three times a day (TID) | INTRAMUSCULAR | Status: AC
  Administered 2020-12-17 – 2020-12-18 (×6): 30 mg via INTRAVENOUS
  Filled 2020-12-17 (×6): qty 1

## 2020-12-17 MED ORDER — OXYCODONE HCL 5 MG PO TABS
5.0000 mg | ORAL_TABLET | ORAL | Status: DC | PRN
  Administered 2020-12-19: 5 mg via ORAL
  Filled 2020-12-17: qty 1

## 2020-12-17 MED ORDER — OXYCODONE HCL 5 MG PO TABS: 10.0000 mg | ORAL_TABLET | ORAL | Status: DC | PRN

## 2020-12-17 MED ORDER — HYDROMORPHONE HCL 1 MG/ML IJ SOLN: 0.5000 mg | INTRAMUSCULAR | Status: DC | PRN

## 2020-12-17 NOTE — Progress Notes (Signed)
Patient can perform Flutter valve independently with no issues.

## 2020-12-17 NOTE — Progress Notes (Signed)
Pulmonary Progress Note         Date: 12/17/2020,   MRN# [id number] Nicholas Gallagher Jun 22, 1981     AdmissionWeight: 102.1 kg                 CurrentWeight: 102.1 kg   Referring physician: Dr Lemar Livings   CHIEF COMPLAINT:   Febrile illness with abnormal chest imaging   HISTORY OF PRESENT ILLNESS    This 40 year old male s/p lap chole 5/25, he does not have baseline medical problems and generally does not take medication. He had abd discomfort with flatus post operatively.  He has developed some pain and noted to have shallow breathing.   He was noted to have fever >102 and tachycardia.  He had  CT imaging done which I reviewed personally.   Labwork significant for leukocytosis and transaminitis, with improved elevation of tbili.  He is urinating well and BP is stable.     12/15/20- patient with trending up leukocytosis. Had HIDA today.  S/p lasix with diuresis.  He received metaneb and atelectasis appears improved upon review of interval chest x rays.  Hepating function panel is improved, lipase is trending down. He is ambulating well on his own. He was febrile overnight Tmax >102 and required Oxygen 2L/min [place].  Patient shares he feels better today.   12/16/20- patient reports "I turned the corner" able to lay in bed in supine position and sleep without pain. He is eating , no BM.  Blood work with further improved transaminitis and leucocytosis with improved numbers . PCT trending up.    12/17/20- patient is improved.  He had cxr this am which is mildy improved bilaterally.  WBC count with downtrend. He is on room air, reports improved resp status. UOP .   PAST MEDICAL HISTORY   Past Medical History:  Diagnosis Date  . Gallstones      SURGICAL HISTORY   Past Surgical History:  Procedure Laterality Date  . CHOLECYSTECTOMY N/A 12/11/2020   Procedure: LAPAROSCOPIC CHOLECYSTECTOMY WITH INTRAOPERATIVE CHOLANGIOGRAM;  Surgeon: Earline Mayotte, MD;  Location: [facility]  ORS;  Service: General;  Laterality: N/A;  . ERCP N/A 12/12/2020   Procedure: ENDOSCOPIC RETROGRADE CHOLANGIOPANCREATOGRAPHY (ERCP);  Surgeon: Midge Minium, MD;  Location: [facility] ENDOSCOPY;  Service: Endoscopy;  Laterality: N/A;  . NO PAST SURGERIES       FAMILY HISTORY   History reviewed. No pertinent family history.   SOCIAL HISTORY   Social History   Tobacco Use  . Smoking status: Never Smoker  . Smokeless tobacco: Never Used  Vaping Use  . Vaping Use: Never used  Substance Use Topics  . Alcohol use: Yes    Comment: occassional  . Drug use: Never     MEDICATIONS    Home Medication:    Current Medication:  Current Facility-Administered Medications:  .  0.9 % NaCl with KCl 40 mEq / L  infusion, , Intravenous, Continuous, [name], Merrily Pew, MD, Last Rate: 50 mL/hr at 12/17/20 0448, Infusion Verify at 12/17/20 0448 .  acetaminophen (TYLENOL) tablet 650 mg, 650 mg, Oral, Q4H PRN, Earline Mayotte, MD, 650 mg at 12/16/20 1304 .  bisacodyl (DULCOLAX) suppository 10 mg, 10 mg, Rectal, Daily PRN, Vida Rigger, MD, 10 mg at 12/16/20 1423 .  enoxaparin (LOVENOX) injection 40 mg, 40 mg, Subcutaneous, Q24H, [name], Merrily Pew, MD, 40 mg at 12/16/20 0857 .  HYDROmorphone (DILAUDID) injection 1 mg, 1 mg, Intravenous, Q3H PRN, Earline Mayotte, MD, 1 mg at 12/17/20  0559 .  lactated ringers bolus 1,000 mL, 1,000 mL, Intravenous, Once, Sakai, Isami, DO .  metoCLOPramide (REGLAN) tablet 10 mg, 10 mg, Oral, TID AC & HS, [name], Merrily Pew, MD, 10 mg at 12/16/20 2018 .  ondansetron (ZOFRAN) injection 4 mg, 4 mg, Intravenous, Q4H PRN, Earline Mayotte, MD, 4 mg at 12/14/20 1858 .  oxyCODONE (Oxy IR/ROXICODONE) immediate release tablet 10 mg, 10 mg, Oral, Q4H PRN, Earline Mayotte, MD, 10 mg at 12/17/20 0641 .  piperacillin-tazobactam (ZOSYN) IVPB 3.375 g, 3.375 g, Intravenous, Q8H, Rauer, Robyne Peers, RPH, Last Rate: 12.5 mL/hr at 12/17/20 0602, 3.375 g at 12/17/20  0602    ALLERGIES   Patient has no known allergies.     REVIEW OF SYSTEMS    Review of Systems:  Gen:  Denies  fever, sweats, chills weigh loss  HEENT: Denies blurred vision, double vision, ear pain, eye pain, hearing loss, nose bleeds, sore throat Cardiac:  No dizziness, chest pain or heaviness, chest tightness,edema Resp:   Denies cough or sputum porduction, shortness of breath,wheezing, hemoptysis,  Gi: +abd pain Gu:  Denies bladder incontinence, burning urine Ext:   Denies Joint pain, stiffness or swelling Skin: Denies  skin rash, easy bruising or bleeding or hives Endoc:  Denies polyuria, polydipsia , polyphagia or weight change Psych:   Denies depression, insomnia or hallucinations   Other:  All other systems negative   VS: BP (!) 142/100 (BP Location: Left Arm)   Pulse (!) 110   Temp 99.6 F (37.6 C) (Oral)   Resp 16   Ht 6\' 1"  (1.854 m)   Wt 102.1 kg   SpO2 92%   BMI 29.70 kg/m      PHYSICAL EXAM    GENERAL:NAD, no fevers, chills, no weakness no fatigue HEAD: Normocephalic, atraumatic.  EYES: Pupils equal, round, reactive to light. Extraocular muscles intact. No scleral icterus.  MOUTH: Moist mucosal membrane. Dentition intact. No abscess noted.  EAR, NOSE, THROAT: Clear without exudates. No external lesions.  NECK: Supple. No thyromegaly. No nodules. No JVD.  PULMONARY: improved with now clear to auscultation CARDIOVASCULAR: S1 and S2. Regular rate and rhythm. No murmurs, rubs, or gallops. No edema. Pedal pulses 2+ bilaterally.  GASTROINTESTINAL: +abd tenderness MUSCULOSKELETAL: No swelling, clubbing, or edema. Range of motion full in all extremities.  NEUROLOGIC: Cranial nerves II through XII are intact. No gross focal neurological deficits. Sensation intact. Reflexes intact.  SKIN: No ulceration, lesions, rashes, or cyanosis. Skin warm and dry. Turgor intact.  PSYCHIATRIC: Mood, affect within normal limits. The patient is awake, alert and oriented  x 3. Insight, judgment intact.       IMAGING       CT ABDOMEN PELVIS WO CONTRAST  Result Date: 12/12/2020 CLINICAL DATA:  Abdominal distension. Gallbladder removed today with worsening abdominal pain and right neck pain. Postop day 0 EXAM: CT ABDOMEN AND PELVIS WITHOUT CONTRAST TECHNIQUE: Multidetector CT imaging of the abdomen and pelvis was performed following the standard protocol without IV contrast. COMPARISON:  Ultrasound abdomen 12/01/2020 FINDINGS: Lower chest: Bilateral lower lobe subsegmental atelectasis/consolidations, right greater than left. Hepatobiliary: No focal liver abnormality. Status post cholecystectomy. Trace fluid along the gallbladder fossa likely postsurgical. No biliary dilatation. Pancreas: No focal lesion. Normal pancreatic contour. No surrounding inflammatory changes. No main pancreatic ductal dilatation. Spleen: Normal in size without focal abnormality. Adrenals/Urinary Tract: No adrenal nodule bilaterally. No nephrolithiasis, no hydronephrosis, and no contour-deforming renal mass. No ureterolithiasis or hydroureter. The urinary bladder is unremarkable. Stomach/Bowel: Stomach  is within normal limits. No evidence of bowel wall thickening or dilatation. Appendix appears normal. Vascular/Lymphatic: No significant vascular findings are present. No enlarged abdominal or pelvic lymph nodes. Reproductive: Prostate is unremarkable. Other: Trace perihepatic/infra diaphragmatic free fluid and foci of gas. Trace free fluid within the pelvis. No organized fluid collection. Musculoskeletal: No abdominal wall hernia or abnormality. No suspicious lytic or blastic osseous lesions. No acute displaced fracture. IMPRESSION: 1. Bilateral lower lobe consolidations, right greater than left, likely represent atelectasis in the postsurgical setting peer 2. Trace perihepatic/infra-diaphragmatic free fluid and foci of gas likely postsurgical in etiology in the setting of post cholecystectomy.  Electronically Signed   By: Tish Frederickson M.D.   On: 12/12/2020 00:42   US Abdomen Complete  Result Date: 12/15/2020 CLINICAL DATA:  Status post cholecystectomy several days ago with persistent abdominal pain EXAM: ABDOMEN ULTRASOUND COMPLETE COMPARISON:  CT from 12/14/2020, a paddle biliary scan from earlier in the same day. FINDINGS: Gallbladder: Gallbladder is been surgically removed. Air-fluid collection is noted within the gallbladder fossa measuring 4.6 x 2.3 x 2.2 cm. This is roughly similar to that seen on the prior CT examination and consistent with the post cholecystectomy state. Recent HIDA scan shows no evidence of biliary leak. Common bile duct: Diameter: 4 mm Liver: Increase in echogenicity consistent with fatty infiltration. No focal mass is noted. Portal vein is patent on color Doppler imaging with normal direction of blood flow towards the liver. IVC: No abnormality visualized. Pancreas: Not well visualized. Spleen: Size and appearance within normal limits. Right Kidney: Length: 11.6 cm. Echogenicity within normal limits. No mass or hydronephrosis visualized. Left Kidney: Length: 10.9 cm. Echogenicity within normal limits. No mass or hydronephrosis visualized. Abdominal aorta: No aneurysm visualized. Other findings: Mild ascites is noted similar to that seen on recent CT. IMPRESSION: Postoperative fluid collection in the gallbladder fossa similar to that seen on CT from the previous day. Recent HIDA scan shows no evidence of biliary leak. Mild ascites. No other focal abnormality is noted. Electronically Signed   By: Alcide Clever M.D.   On: 12/15/2020 15:39   CT ABDOMEN PELVIS W CONTRAST  Result Date: 12/14/2020 CLINICAL DATA:  Acute, severe pancreatitis EXAM: CT ABDOMEN AND PELVIS WITH CONTRAST TECHNIQUE: Multidetector CT imaging of the abdomen and pelvis was performed using the standard protocol following bolus administration of intravenous contrast. CONTRAST:  OMNIPAQUE IOHEXOL 300  MG/ML  SOLN COMPARISON:  Abdominal MRI 12/12/2020 FINDINGS: Lower chest: Multi segment atelectasis at the lung bases with small pleural effusions. Hepatobiliary: No focal liver abnormality.Recent cholecystectomy. Gas and fluid collection without defined wall in the gallbladder fossa, 4.4 x 2.3 cm on axial slices. There is a stent in the CBD without ductal dilatation or malpositioning. A retention loop is seen at the level of the duodenum. Pancreas: Peripancreatic edema and generalized mild pancreas expansion. No necrosis or pancreatic collection. No pancreatic ductal dilatation. Spleen: Unremarkable. Adrenals/Urinary Tract: Negative adrenals. No hydronephrosis or stone. Unremarkable bladder. Stomach/Bowel:  No obstruction. No appendicitis. Vascular/Lymphatic: No acute vascular abnormality. No mass or adenopathy. Reproductive:No pathologic findings. Other: No ascites or pneumoperitoneum.  Small volume ascites Musculoskeletal: No acute abnormalities. IMPRESSION: 1. Non organized gas and fluid collection in the gallbladder fossa measuring up to 4.4 x 2.3 cm. 2. Acute edematous pancreatitis. The CBD stent is in good position with no ductal dilatation. 3. Multi segment atelectasis with small pleural effusions. Small volume non loculated ascites. Electronically Signed   By: Marnee Spring M.D.   On:  12/14/2020 10:30   DG Chest Port 1 View  Result Date: 12/15/2020 CLINICAL DATA:  40 year old male with recent cholecystectomy. EXAM: PORTABLE CHEST - 1 VIEW COMPARISON:  12/14/2020 FINDINGS: The mediastinal contours are within normal limits. No cardiomegaly. Low lung volumes. Similar appearing bibasilar subsegmental atelectasis. Trace left pleural effusion, unchanged. No pneumothorax. No acute osseous abnormality. IMPRESSION: Similar appearing bibasilar subsegmental atelectasis and trace left pleural effusion. Electronically Signed   By: Marliss Cootsylan  Suttle MD   On: 12/15/2020 09:38   DG Chest Port 1 View  Result Date:  12/14/2020 CLINICAL DATA:  Follow-up EXAM: PORTABLE CHEST 1 VIEW COMPARISON:  Dec 12, 2020 FINDINGS: The cardiomediastinal silhouette is unchanged in contour.Low lung volumes. Trace bilateral pleural effusion. No pneumothorax. Bibasilar linear opacities, consistent with atelectasis. These are mildly increased in comparison to prior. Visualized abdomen is unremarkable. No acute osseous abnormality. IMPRESSION: Bibasilar linear opacities, consistent with atelectasis. These are mildly increased in comparison to prior. Electronically Signed   By: Meda KlinefelterStephanie  Peacock MD   On: 12/14/2020 15:12   DG Chest Portable 1 View  Result Date: 12/12/2020 CLINICAL DATA:  Short of breath. Pt had gall bladder removed today here for worsening abd pain and right neck pain. EXAM: PORTABLE CHEST 1 VIEW COMPARISON:  None. FINDINGS: Prominent cardiac silhouette likely due to AP portable technique as well as low lung volumes. The heart size and mediastinal contours are within normal limits. Low lung volumes with bibasilar atelectasis. No pulmonary edema. No pleural effusion. No pneumothorax. No acute osseous abnormality. IMPRESSION: Low lung volumes with bibasilar atelectasis. Electronically Signed   By: Tish FredericksonMorgane  Naveau M.D.   On: 12/12/2020 00:43   DG C-Arm 1-60 Min-No Report  Result Date: 12/12/2020 Fluoroscopy was utilized by the requesting physician.  No radiographic interpretation.   MR ABDOMEN MRCP W WO CONTAST  Result Date: 12/12/2020 CLINICAL DATA:  Abdominal pain, suspected biliary duct obstruction. Post cholecystectomy. EXAM: MRI ABDOMEN WITH AND WITHOUT CONTRAST (WITH MRCP) TECHNIQUE: Multiplanar multisequence MR imaging of the abdomen was performed prior to and following the administration of intravenous contrast. Heavily T2-weighted images of the biliary and pancreatic ducts were obtained, and three-dimensional MRCP images were rendered by post processing. CONTRAST:  10mL GADAVIST GADOBUTROL 1 MMOL/ML IV SOLN  COMPARISON:  CT abdomen and pelvis of Dec 12, 2020. FINDINGS: Lower chest: Dense basilar airspace disease at the RIGHT lung base with similar appearance to prior imaging obtained on the same date. Also with LEFT lower lobe airspace disease. Hepatobiliary: Perihepatic ascites. Small amount of fluid in the gallbladder fossa. The common bile duct is of normal caliber. No sign of filling defect in the biliary tree. No focal, suspicious hepatic lesion. Portal vein is patent. Hepatic veins are patent. On MRCP sequences fluid can be seen tracking from the gallbladder fossa incontinuity with small to moderate volume of perihepatic fluid. There is also interloop fluid in the upper abdomen adjacent to the RIGHT colon. This is best seen on T2 weighted imaging on image 37 of series 40 with respect to interloop fluid. Pancreas:  Normal, without mass, inflammation or ductal dilatation. Spleen:  Normal spleen. Adrenals/Urinary Tract: Adrenal glands are normal. Symmetric renal enhancement. No hydronephrosis. Stomach/Bowel: No sign of acute gastrointestinal process to the extent evaluated on abdominal MRI. Vascular/Lymphatic: Patent abdominal vessels. There is no gastrohepatic or hepatoduodenal ligament lymphadenopathy. No retroperitoneal or mesenteric lymphadenopathy. Other: Perihepatic ascites and fluid tracking into the RIGHT upper quadrant in communication with fluid about the gallbladder fossa. Small amount of pneumoperitoneum as on  the previous CT anterior to the liver margin. Musculoskeletal: No suspicious bone lesions identified. IMPRESSION: Small to moderate volume of perihepatic ascites slightly increased since the previous study. There is also some fluid and or small amount of blood inferior to the RIGHT hemi liver. While constellation of findings could be seen in the postoperative setting the slight interval increase in fluid about the liver, in communication with the gallbladder fossa in the setting of increasing  bilirubin raises suspicion for biliary leak. Nuclear medicine hepatobiliary scan may be helpful to exclude the possibility of biliary leak. Dense basilar consolidative changes RIGHT greater than LEFT persist likely related to volume loss, difficult to exclude the possibility of developing infection, attention on follow-up. These results will be called to the ordering clinician or representative by the Radiologist Assistant, and communication documented in the PACS or [employer]. Electronically Signed   By: Donzetta Kohut M.D.   On: 12/12/2020 11:41   NM HEPATOBILIARY LEAK (POST-SURGICAL)  Result Date: 12/15/2020 CLINICAL DATA:  Status post cholecystectomy, complicated by bile leak, status post ERCP stent placement EXAM: NUCLEAR MEDICINE HEPATOBILIARY IMAGING TECHNIQUE: Sequential images of the abdomen were obtained out to 60 minutes following intravenous administration of radiopharmaceutical. RADIOPHARMACEUTICALS:  7.95 mCi Tc-82m  Choletec IV COMPARISON:  12/12/2020 FINDINGS: Prompt uptake and biliary excretion of activity by the liver is seen. Gallbladder is surgically absent. Biliary activity passes into small bowel, consistent with patent common bile duct. No evidence of extraluminal radiotracer accumulation. IMPRESSION: 1.  Negative examination for bile leak status post cholecystectomy. 2.  The common bile duct is patent. Electronically Signed   By: Lauralyn Primes M.D.   On: 12/15/2020 12:29   NM HEPATOBILIARY LEAK (POST-SURGICAL)  Result Date: 12/12/2020 CLINICAL DATA:  Status post cholecystectomy. Perihepatic fluid on recent MRI. Evaluate for postop bile leak. EXAM: NUCLEAR MEDICINE HEPATOBILIARY IMAGING TECHNIQUE: Sequential images of the abdomen were obtained out to 60 minutes following intravenous administration of radiopharmaceutical. RADIOPHARMACEUTICALS:  7.4 mCi Tc-57m  Choletec IV COMPARISON:  None. FINDINGS: Prompt uptake and biliary excretion of activity by the liver is seen. Patient is  status post cholecystectomy. Accumulation of biliary activity is seen in the gallbladder fossa, which then shows progressive extension into the perihepatic spaces, consistent with postop bile leak. No biliary activity reaches the small bowel, however patency of the common bile duct cannot be determined due to the large bile leak. IMPRESSION: Large post-op bile leak. No biliary activity reaches small bowel, however common bile duct patency cannot be determined due to the large bowel loop. These results will be called to the ordering clinician or representative by the Radiologist Assistant, and communication documented in the PACS or [employer]. Electronically Signed   By: Danae Orleans M.D.   On: 12/12/2020 15:20   US Abdomen Limited RUQ (LIVER/GB)  Result Date: 12/01/2020 CLINICAL DATA:  Epigastric pain EXAM: ULTRASOUND ABDOMEN LIMITED RIGHT UPPER QUADRANT COMPARISON:  None. FINDINGS: Gallbladder: Cholelithiasis including an 8 mm stone at the neck. No gallbladder wall thickening but there is focal tenderness by sonographer exam. No pericholecystic fluid. Common bile duct: Diameter: 5 mm. Liver: Echogenic liver with diminished acoustic penetration. Portal vein is patent on color Doppler imaging with normal direction of blood flow towards the liver. Other: None. IMPRESSION: 1. Cholelithiasis including stone at the gallbladder neck. There is focal tenderness which could reflect gallbladder obstruction, but no wall thickening or edema typical of acute cholecystitis. 2. Hepatic steatosis. Electronically Signed   By: Kathrynn Ducking.D.  On: 12/01/2020 05:46      ASSESSMENT/PLAN    Acute febrile illness with hypoxemia -bibasilar atelectasis cannot rule out consolidation/infiltrate -on cipro will broaden to zosyn due to persistent fevers and trending up leucocytosis -respiratory infection workup thus far is negative, blood cultures in process.  -s/p HIDA -[place] abd complete in process- checking for  nephrolithiasis -MRSA pcr-negative -REsp viral panel-negative - COVID19 negative - supplemental O2 during my evaluation 2L/min -legionella ab -strep pneumoniae ur AG-negative -sputum resp cultures -reviewed pertinent imaging with patient today -PT/OT for d/c planning  -US renal - mild ascites, GB surgical changes noted -LDH elevation - query due to pancreatitis/physical stress due to surgery    Acute Pancreatitis   - GB related   - note s/p ERCP -resolving - patient eating  -there is pleural effusion and atelectasis with abd asictes -    -good urine output and eating without nausea -LDH elevated   Opiod induced Constipation   s/p Dulcolax supposittory - +BM overnight  Bibasilar atelectasis  - Incentive spirometer - patient takes tidal volume 1500cc   - patient will need MetaNEB with saline for recruitment      Thank you for allowing me to participate in the care of this patient.   Patient/Family are satisfied with care plan and all questions have been answered.  This document was prepared using Dragon voice recognition software and may include unintentional dictation errors.     Vida Rigger, M.D.  Division of Pulmonary & Critical Care Medicine  [facility]

## 2020-12-17 NOTE — Progress Notes (Signed)
Tmax 99.9.  Tachycardia persists, normal RR. Tolerating liquids well. U/O excellent.  Suppository w/ small BM yesterday.  No nausea.  +/- appetite.  Pain on scheduled narcotics 4-6.  Mid-abdomen to under the ribs.  Back pain (chronic prior to admission) intermittent.  Lungs: Clear. Inspirex at 2000. Cardio: RR, HR 110. ABD: Unchanged mild distension, tympanitic.No focal tenderness. Extrem: Soft.   Labs: Lipase normal.   WBC unchanged.  Discussion re: role of injectable narcotics for pain control, risk of addiction.  IMP: Persistent leukocytosis with clinical markedly improved pulmonary function. Slow GI function: pancreatitis vs bile leakage (prior to ERCP) vs narcotics vs pulmonary (Lower lobe inflammatory process).  Plan: D/C IV fluids.  Full liquids as tolerated. D/C standing Dilaudid order: rescue doses only. Add Toradol, d/c Lovenox (patient ambulating miles/day).

## 2020-12-18 ENCOUNTER — Encounter: Payer: Self-pay | Admitting: General Surgery

## 2020-12-18 ENCOUNTER — Inpatient Hospital Stay: Payer: BC Managed Care – PPO

## 2020-12-18 DIAGNOSIS — K851 Biliary acute pancreatitis without necrosis or infection: Secondary | ICD-10-CM

## 2020-12-18 DIAGNOSIS — K8309 Other cholangitis: Secondary | ICD-10-CM

## 2020-12-18 LAB — HEPATIC FUNCTION PANEL
ALT: 73 U/L — ABNORMAL HIGH (ref 0–44)
AST: 39 U/L (ref 15–41)
Albumin: 2.5 g/dL — ABNORMAL LOW (ref 3.5–5.0)
Alkaline Phosphatase: 159 U/L — ABNORMAL HIGH (ref 38–126)
Bilirubin, Direct: 0.6 mg/dL — ABNORMAL HIGH (ref 0.0–0.2)
Indirect Bilirubin: 0.7 mg/dL (ref 0.3–0.9)
Total Bilirubin: 1.3 mg/dL — ABNORMAL HIGH (ref 0.3–1.2)
Total Protein: 6.2 g/dL — ABNORMAL LOW (ref 6.5–8.1)

## 2020-12-18 LAB — CBC WITH DIFFERENTIAL/PLATELET
Abs Immature Granulocytes: 0.48 10*3/uL — ABNORMAL HIGH (ref 0.00–0.07)
Basophils Absolute: 0.1 10*3/uL (ref 0.0–0.1)
Basophils Relative: 1 %
Eosinophils Absolute: 0.2 10*3/uL (ref 0.0–0.5)
Eosinophils Relative: 1 %
HCT: 35.3 % — ABNORMAL LOW (ref 39.0–52.0)
Hemoglobin: 12 g/dL — ABNORMAL LOW (ref 13.0–17.0)
Immature Granulocytes: 2 %
Lymphocytes Relative: 7 %
Lymphs Abs: 1.7 10*3/uL (ref 0.7–4.0)
MCH: 29.9 pg (ref 26.0–34.0)
MCHC: 34 g/dL (ref 30.0–36.0)
MCV: 88 fL (ref 80.0–100.0)
Monocytes Absolute: 2.8 10*3/uL — ABNORMAL HIGH (ref 0.1–1.0)
Monocytes Relative: 11 %
Neutro Abs: 19 10*3/uL — ABNORMAL HIGH (ref 1.7–7.7)
Neutrophils Relative %: 78 %
Platelets: 478 10*3/uL — ABNORMAL HIGH (ref 150–400)
RBC: 4.01 MIL/uL — ABNORMAL LOW (ref 4.22–5.81)
RDW: 12.7 % (ref 11.5–15.5)
WBC: 24.2 10*3/uL — ABNORMAL HIGH (ref 4.0–10.5)
nRBC: 0 % (ref 0.0–0.2)

## 2020-12-18 LAB — BASIC METABOLIC PANEL
Anion gap: 10 (ref 5–15)
BUN: 8 mg/dL (ref 6–20)
CO2: 27 mmol/L (ref 22–32)
Calcium: 8.3 mg/dL — ABNORMAL LOW (ref 8.9–10.3)
Chloride: 102 mmol/L (ref 98–111)
Creatinine, Ser: 0.82 mg/dL (ref 0.61–1.24)
GFR, Estimated: >60 mL/min (ref >60)
Glucose, Bld: 111 mg/dL — ABNORMAL HIGH (ref 70–99)
Potassium: 3.4 mmol/L — ABNORMAL LOW (ref 3.5–5.1)
Sodium: 139 mmol/L (ref 135–145)

## 2020-12-18 MED ORDER — IOHEXOL 300 MG/ML  SOLN
100.0000 mL | Freq: Once | INTRAMUSCULAR | Status: AC | PRN
  Administered 2020-12-18: 100 mL via INTRAVENOUS

## 2020-12-18 MED ORDER — IOHEXOL 9 MG/ML PO SOLN
500.0000 mL | ORAL | Status: AC
  Administered 2020-12-18 (×2): 500 mL via ORAL

## 2020-12-18 NOTE — Progress Notes (Signed)
Pulmonary Progress Note         Date: 12/18/2020,   MRN# [id number] Nicholas Gallagher 03-30-1981     AdmissionWeight: 102.1 kg                 CurrentWeight: 102.1 kg   Referring physician: Dr Lemar Livings   CHIEF COMPLAINT:   Febrile illness with abnormal chest imaging   HISTORY OF PRESENT ILLNESS    This 40 year old male s/p lap chole 5/25, he does not have baseline medical problems and generally does not take medication. He had abd discomfort with flatus post operatively.  He has developed some pain and noted to have shallow breathing.   He was noted to have fever >102 and tachycardia.  He had  CT imaging done which I reviewed personally.   Labwork significant for leukocytosis and transaminitis, with improved elevation of tbili.  He is urinating well and BP is stable.     12/15/20- patient with trending up leukocytosis. Had HIDA today.  S/p lasix with diuresis.  He received metaneb and atelectasis appears improved upon review of interval chest x rays.  Hepating function panel is improved, lipase is trending down. He is ambulating well on his own. He was febrile overnight Tmax >102 and required Oxygen 2L/min [place].  Patient shares he feels better today.   12/16/20- patient reports "I turned the corner" able to lay in bed in supine position and sleep without pain. He is eating , no BM.  Blood work with further improved transaminitis and leucocytosis with improved numbers . PCT trending up.    12/17/20- patient is improved.  He had cxr this am which is mildy improved bilaterally.  WBC count with downtrend. He is on room air, reports improved resp status. UOP .   12/18/2020- patient is improved, he feels abd pain is better. His blood work has improved with trending down WBC count.  He has imaging scheduled for today. Ive discussed care plan with patient and wife as well as surgery and ID team. He continues to do self PT.   PAST MEDICAL HISTORY   Past Medical History:   Diagnosis Date  . Gallstones      SURGICAL HISTORY   Past Surgical History:  Procedure Laterality Date  . CHOLECYSTECTOMY N/A 12/11/2020   Procedure: LAPAROSCOPIC CHOLECYSTECTOMY WITH INTRAOPERATIVE CHOLANGIOGRAM;  Surgeon: Earline Mayotte, MD;  Location: [facility] ORS;  Service: General;  Laterality: N/A;  . ERCP N/A 12/12/2020   Procedure: ENDOSCOPIC RETROGRADE CHOLANGIOPANCREATOGRAPHY (ERCP);  Surgeon: Midge Minium, MD;  Location: [facility] ENDOSCOPY;  Service: Endoscopy;  Laterality: N/A;  . NO PAST SURGERIES       FAMILY HISTORY   History reviewed. No pertinent family history.   SOCIAL HISTORY   Social History   Tobacco Use  . Smoking status: Never Smoker  . Smokeless tobacco: Never Used  Vaping Use  . Vaping Use: Never used  Substance Use Topics  . Alcohol use: Yes    Comment: occassional  . Drug use: Never     MEDICATIONS    Home Medication:    Current Medication:  Current Facility-Administered Medications:  .  acetaminophen (TYLENOL) tablet 650 mg, 650 mg, Oral, Q4H PRN, Earline Mayotte, MD, 650 mg at 12/17/20 2101 .  bisacodyl (DULCOLAX) suppository 10 mg, 10 mg, Rectal, Daily PRN, Vida Rigger, MD, 10 mg at 12/16/20 1423 .  HYDROmorphone (DILAUDID) injection 0.5 mg, 0.5 mg, Intravenous, Q3H PRN, Lemar Livings Merrily Pew, MD .  ketorolac (TORADOL) 30 MG/ML  injection 30 mg, 30 mg, Intravenous, Q8H, [name], Merrily Pew, MD, 30 mg at 12/18/20 0549 .  lactated ringers bolus 1,000 mL, 1,000 mL, Intravenous, Once, Sakai, Isami, DO .  metoCLOPramide (REGLAN) tablet 10 mg, 10 mg, Oral, TID AC & HS, [name], Merrily Pew, MD, 10 mg at 12/18/20 0752 .  ondansetron (ZOFRAN) injection 4 mg, 4 mg, Intravenous, Q4H PRN, Earline Mayotte, MD, 4 mg at 12/14/20 1858 .  oxyCODONE (Oxy IR/ROXICODONE) immediate release tablet 10 mg, 10 mg, Oral, Q4H PRN, Lemar Livings, Merrily Pew, MD .  oxyCODONE (Oxy IR/ROXICODONE) immediate release tablet 5 mg, 5 mg, Oral, Q4H PRN, Lemar Livings, Merrily Pew,  MD .  piperacillin-tazobactam (ZOSYN) IVPB 3.375 g, 3.375 g, Intravenous, Q8H, Rauer, Samantha O, RPH, Last Rate: 12.5 mL/hr at 12/18/20 0547, 3.375 g at 12/18/20 0547    ALLERGIES   Patient has no known allergies.     REVIEW OF SYSTEMS    Review of Systems:  Gen:  Denies  fever, sweats, chills weigh loss  HEENT: Denies blurred vision, double vision, ear pain, eye pain, hearing loss, nose bleeds, sore throat Cardiac:  No dizziness, chest pain or heaviness, chest tightness,edema Resp:   Denies cough or sputum porduction, shortness of breath,wheezing, hemoptysis,  Gi: +abd pain improved  Gu:  Denies bladder incontinence, burning urine Ext:   Denies Joint pain, stiffness or swelling Skin: Denies  skin rash, easy bruising or bleeding or hives Endoc:  Denies polyuria, polydipsia , polyphagia or weight change Psych:   Denies depression, insomnia or hallucinations   Other:  All other systems negative   VS: BP (!) 140/94 (BP Location: Left Arm)   Pulse 86   Temp 98.3 F (36.8 C) (Oral)   Resp 18   Ht  (1.854 m)   Wt 102.1 kg   SpO2 97%   BMI 29.70 kg/m      PHYSICAL EXAM    GENERAL:NAD, no fevers, chills, no weakness no fatigue HEAD: Normocephalic, atraumatic.  EYES: Pupils equal, round, reactive to light. Extraocular muscles intact. No scleral icterus.  MOUTH: Moist mucosal membrane. Dentition intact. No abscess noted.  EAR, NOSE, THROAT: Clear without exudates. No external lesions.  NECK: Supple. No thyromegaly. No nodules. No JVD.  PULMONARY: improved with now clear to auscultation CARDIOVASCULAR: S1 and S2. Regular rate and rhythm. No murmurs, rubs, or gallops. No edema. Pedal pulses 2+ bilaterally.  GASTROINTESTINAL: +abd tenderness MUSCULOSKELETAL: No swelling, clubbing, or edema. Range of motion full in all extremities.  NEUROLOGIC: Cranial nerves II through XII are intact. No gross focal neurological deficits. Sensation intact. Reflexes intact.  SKIN:  No ulceration, lesions, rashes, or cyanosis. Skin warm and dry. Turgor intact.  PSYCHIATRIC: Mood, affect within normal limits. The patient is awake, alert and oriented x 3. Insight, judgment intact.       IMAGING       CT ABDOMEN PELVIS WO CONTRAST  Result Date: 12/12/2020 CLINICAL DATA:  Abdominal distension. Gallbladder removed today with worsening abdominal pain and right neck pain. Postop day 0 EXAM: CT ABDOMEN AND PELVIS WITHOUT CONTRAST TECHNIQUE: Multidetector CT imaging of the abdomen and pelvis was performed following the standard protocol without IV contrast. COMPARISON:  Ultrasound abdomen 12/01/2020 FINDINGS: Lower chest: Bilateral lower lobe subsegmental atelectasis/consolidations, right greater than left. Hepatobiliary: No focal liver abnormality. Status post cholecystectomy. Trace fluid along the gallbladder fossa likely postsurgical. No biliary dilatation. Pancreas: No focal lesion. Normal pancreatic contour. No surrounding inflammatory changes. No main pancreatic ductal dilatation. Spleen: Normal in  size without focal abnormality. Adrenals/Urinary Tract: No adrenal nodule bilaterally. No nephrolithiasis, no hydronephrosis, and no contour-deforming renal mass. No ureterolithiasis or hydroureter. The urinary bladder is unremarkable. Stomach/Bowel: Stomach is within normal limits. No evidence of bowel wall thickening or dilatation. Appendix appears normal. Vascular/Lymphatic: No significant vascular findings are present. No enlarged abdominal or pelvic lymph nodes. Reproductive: Prostate is unremarkable. Other: Trace perihepatic/infra diaphragmatic free fluid and foci of gas. Trace free fluid within the pelvis. No organized fluid collection. Musculoskeletal: No abdominal wall hernia or abnormality. No suspicious lytic or blastic osseous lesions. No acute displaced fracture. IMPRESSION: 1. Bilateral lower lobe consolidations, right greater than left, likely represent atelectasis in the  postsurgical setting peer 2. Trace perihepatic/infra-diaphragmatic free fluid and foci of gas likely postsurgical in etiology in the setting of post cholecystectomy. Electronically Signed   By: Tish Frederickson M.D.   On: 12/12/2020 00:42   DG Chest 2 View  Result Date: 12/17/2020 CLINICAL DATA:  Post cholecystectomy complicated by development of a bile leak. EXAM: CHEST - 2 VIEW COMPARISON:  12/15/2020; abdominal radiograph-earlier same day FINDINGS: Grossly unchanged cardiac silhouette and mediastinal contours. Unchanged trace left-sided effusion and associated bibasilar opacities, right greater than left. Mild pulmonary venous congestion without frank evidence of edema. No acute osseous abnormalities. Small amount of air is seen within the gallbladder fossa, better demonstrated on dedicated abdominal radiographs. Post cholecystectomy. Biliary stent overlies expected location of the CBD. IMPRESSION: 1. Similar findings of cardiomegaly, trace left-sided pleural effusion and bibasilar heterogeneous opacities, atelectasis versus infiltrate. 2. Pulmonary venous congestion without frank evidence of edema. 3. Small amount of air within the gallbladder fossa as better demonstrated on dedicated abdominal radiographs performed earlier same day. Again, further evaluation with abdominal CT could be performed as indicated. Electronically Signed   By: Simonne Come M.D.   On: 12/17/2020 09:16   US Abdomen Complete  Result Date: 12/15/2020 CLINICAL DATA:  Status post cholecystectomy several days ago with persistent abdominal pain EXAM: ABDOMEN ULTRASOUND COMPLETE COMPARISON:  CT from 12/14/2020, a paddle biliary scan from earlier in the same day. FINDINGS: Gallbladder: Gallbladder is been surgically removed. Air-fluid collection is noted within the gallbladder fossa measuring 4.6 x 2.3 x 2.2 cm. This is roughly similar to that seen on the prior CT examination and consistent with the post cholecystectomy state. Recent HIDA  scan shows no evidence of biliary leak. Common bile duct: Diameter: 4 mm Liver: Increase in echogenicity consistent with fatty infiltration. No focal mass is noted. Portal vein is patent on color Doppler imaging with normal direction of blood flow towards the liver. IVC: No abnormality visualized. Pancreas: Not well visualized. Spleen: Size and appearance within normal limits. Right Kidney: Length: 11.6 cm. Echogenicity within normal limits. No mass or hydronephrosis visualized. Left Kidney: Length: 10.9 cm. Echogenicity within normal limits. No mass or hydronephrosis visualized. Abdominal aorta: No aneurysm visualized. Other findings: Mild ascites is noted similar to that seen on recent CT. IMPRESSION: Postoperative fluid collection in the gallbladder fossa similar to that seen on CT from the previous day. Recent HIDA scan shows no evidence of biliary leak. Mild ascites. No other focal abnormality is noted. Electronically Signed   By: Alcide Clever M.D.   On: 12/15/2020 15:39   CT ABDOMEN PELVIS W CONTRAST  Result Date: 12/14/2020 CLINICAL DATA:  Acute, severe pancreatitis EXAM: CT ABDOMEN AND PELVIS WITH CONTRAST TECHNIQUE: Multidetector CT imaging of the abdomen and pelvis was performed using the standard protocol following bolus administration of intravenous contrast.  CONTRAST:  OMNIPAQUE IOHEXOL 300 MG/ML  SOLN COMPARISON:  Abdominal MRI 12/12/2020 FINDINGS: Lower chest: Multi segment atelectasis at the lung bases with small pleural effusions. Hepatobiliary: No focal liver abnormality.Recent cholecystectomy. Gas and fluid collection without defined wall in the gallbladder fossa, 4.4 x 2.3 cm on axial slices. There is a stent in the CBD without ductal dilatation or malpositioning. A retention loop is seen at the level of the duodenum. Pancreas: Peripancreatic edema and generalized mild pancreas expansion. No necrosis or pancreatic collection. No pancreatic ductal dilatation. Spleen: Unremarkable.  Adrenals/Urinary Tract: Negative adrenals. No hydronephrosis or stone. Unremarkable bladder. Stomach/Bowel:  No obstruction. No appendicitis. Vascular/Lymphatic: No acute vascular abnormality. No mass or adenopathy. Reproductive:No pathologic findings. Other: No ascites or pneumoperitoneum.  Small volume ascites Musculoskeletal: No acute abnormalities. IMPRESSION: 1. Non organized gas and fluid collection in the gallbladder fossa measuring up to 4.4 x 2.3 cm. 2. Acute edematous pancreatitis. The CBD stent is in good position with no ductal dilatation. 3. Multi segment atelectasis with small pleural effusions. Small volume non loculated ascites. Electronically Signed   By: Marnee Spring M.D.   On: 12/14/2020 10:30   DG Chest Port 1 View  Result Date: 12/15/2020 CLINICAL DATA:  40 year old male with recent cholecystectomy. EXAM: PORTABLE CHEST - 1 VIEW COMPARISON:  12/14/2020 FINDINGS: The mediastinal contours are within normal limits. No cardiomegaly. Low lung volumes. Similar appearing bibasilar subsegmental atelectasis. Trace left pleural effusion, unchanged. No pneumothorax. No acute osseous abnormality. IMPRESSION: Similar appearing bibasilar subsegmental atelectasis and trace left pleural effusion. Electronically Signed   By: Marliss Coots MD   On: 12/15/2020 09:38   DG Chest Port 1 View  Result Date: 12/14/2020 CLINICAL DATA:  Follow-up EXAM: PORTABLE CHEST 1 VIEW COMPARISON:  Dec 12, 2020 FINDINGS: The cardiomediastinal silhouette is unchanged in contour.Low lung volumes. Trace bilateral pleural effusion. No pneumothorax. Bibasilar linear opacities, consistent with atelectasis. These are mildly increased in comparison to prior. Visualized abdomen is unremarkable. No acute osseous abnormality. IMPRESSION: Bibasilar linear opacities, consistent with atelectasis. These are mildly increased in comparison to prior. Electronically Signed   By: Meda Klinefelter MD   On: 12/14/2020 15:12   DG Chest  Portable 1 View  Result Date: 12/12/2020 CLINICAL DATA:  Short of breath. Pt had gall bladder removed today here for worsening abd pain and right neck pain. EXAM: PORTABLE CHEST 1 VIEW COMPARISON:  None. FINDINGS: Prominent cardiac silhouette likely due to AP portable technique as well as low lung volumes. The heart size and mediastinal contours are within normal limits. Low lung volumes with bibasilar atelectasis. No pulmonary edema. No pleural effusion. No pneumothorax. No acute osseous abnormality. IMPRESSION: Low lung volumes with bibasilar atelectasis. Electronically Signed   By: Tish Frederickson M.D.   On: 12/12/2020 00:43   DG Abd 2 Views  Result Date: 12/17/2020 CLINICAL DATA:  Recent cholecystectomy complicated by development of a bile leak requiring biliary stent placement. EXAM: ABDOMEN - 2 VIEW COMPARISON:  Nuclear medicine HIDA scan-12/15/2020; 12/12/2020; ERCP-12/12/2020 FINDINGS: Ingested enteric contrast is seen throughout the colon. There is no significant gaseous distension of the upstream small bowel. Cholecystectomy clips overlie the right upper abdominal quadrant. A biliary stent overlies expected location of the CBD. Small amount of air is seen within the gallbladder fossa, nonspecific though favored to be secondary to a residual cystic duct leak in the setting of a biliary stent, less likely pneumoperitoneum given lack of free air seen below either hemidiaphragm. Limited visualization of the lower thorax  demonstrates a trace left-sided effusion and bibasilar heterogeneous opacities. No acute osseous abnormalities. IMPRESSION: 1. Small amount of air within the gallbladder fossa favored to be secondary to residual cystic duct leak in the setting of a biliary stent, less likely pneumoperitoneum given lack of free air below either hemidiaphragm. Further evaluation with abdominal CT could be performed as indicated. 2. Enteric contrast seen throughout the colon. No evidence of enteric  obstruction. 3. Trace left-sided and bibasilar opacities, atelectasis versus infiltrate. Electronically Signed   By: Simonne Come M.D.   On: 12/17/2020 09:14   DG C-Arm 1-60 Min-No Report  Result Date: 12/12/2020 Fluoroscopy was utilized by the requesting physician.  No radiographic interpretation.   MR ABDOMEN MRCP W WO CONTAST  Result Date: 12/12/2020 CLINICAL DATA:  Abdominal pain, suspected biliary duct obstruction. Post cholecystectomy. EXAM: MRI ABDOMEN WITH AND WITHOUT CONTRAST (WITH MRCP) TECHNIQUE: Multiplanar multisequence MR imaging of the abdomen was performed prior to and following the administration of intravenous contrast. Heavily T2-weighted images of the biliary and pancreatic ducts were obtained, and three-dimensional MRCP images were rendered by post processing. CONTRAST:  44mL GADAVIST GADOBUTROL 1 MMOL/ML IV SOLN COMPARISON:  CT abdomen and pelvis of Dec 12, 2020. FINDINGS: Lower chest: Dense basilar airspace disease at the RIGHT lung base with similar appearance to prior imaging obtained on the same date. Also with LEFT lower lobe airspace disease. Hepatobiliary: Perihepatic ascites. Small amount of fluid in the gallbladder fossa. The common bile duct is of normal caliber. No sign of filling defect in the biliary tree. No focal, suspicious hepatic lesion. Portal vein is patent. Hepatic veins are patent. On MRCP sequences fluid can be seen tracking from the gallbladder fossa incontinuity with small to moderate volume of perihepatic fluid. There is also interloop fluid in the upper abdomen adjacent to the RIGHT colon. This is best seen on T2 weighted imaging on image 37 of series 40 with respect to interloop fluid. Pancreas:  Normal, without mass, inflammation or ductal dilatation. Spleen:  Normal spleen. Adrenals/Urinary Tract: Adrenal glands are normal. Symmetric renal enhancement. No hydronephrosis. Stomach/Bowel: No sign of acute gastrointestinal process to the extent evaluated on  abdominal MRI. Vascular/Lymphatic: Patent abdominal vessels. There is no gastrohepatic or hepatoduodenal ligament lymphadenopathy. No retroperitoneal or mesenteric lymphadenopathy. Other: Perihepatic ascites and fluid tracking into the RIGHT upper quadrant in communication with fluid about the gallbladder fossa. Small amount of pneumoperitoneum as on the previous CT anterior to the liver margin. Musculoskeletal: No suspicious bone lesions identified. IMPRESSION: Small to moderate volume of perihepatic ascites slightly increased since the previous study. There is also some fluid and or small amount of blood inferior to the RIGHT hemi liver. While constellation of findings could be seen in the postoperative setting the slight interval increase in fluid about the liver, in communication with the gallbladder fossa in the setting of increasing bilirubin raises suspicion for biliary leak. Nuclear medicine hepatobiliary scan may be helpful to exclude the possibility of biliary leak. Dense basilar consolidative changes RIGHT greater than LEFT persist likely related to volume loss, difficult to exclude the possibility of developing infection, attention on follow-up. These results will be called to the ordering clinician or representative by the Radiologist Assistant, and communication documented in the PACS or [employer]. Electronically Signed   By: Donzetta Kohut M.D.   On: 12/12/2020 11:41   NM HEPATOBILIARY LEAK (POST-SURGICAL)  Result Date: 12/15/2020 CLINICAL DATA:  Status post cholecystectomy, complicated by bile leak, status post ERCP stent placement EXAM: NUCLEAR  MEDICINE HEPATOBILIARY IMAGING TECHNIQUE: Sequential images of the abdomen were obtained out to 60 minutes following intravenous administration of radiopharmaceutical. RADIOPHARMACEUTICALS:  7.95 mCi Tc-3627m  Choletec IV COMPARISON:  12/12/2020 FINDINGS: Prompt uptake and biliary excretion of activity by the liver is seen. Gallbladder is surgically  absent. Biliary activity passes into small bowel, consistent with patent common bile duct. No evidence of extraluminal radiotracer accumulation. IMPRESSION: 1.  Negative examination for bile leak status post cholecystectomy. 2.  The common bile duct is patent. Electronically Signed   By: Lauralyn PrimesAlex  Bibbey M.D.   On: 12/15/2020 12:29   NM HEPATOBILIARY LEAK (POST-SURGICAL)  Result Date: 12/12/2020 CLINICAL DATA:  Status post cholecystectomy. Perihepatic fluid on recent MRI. Evaluate for postop bile leak. EXAM: NUCLEAR MEDICINE HEPATOBILIARY IMAGING TECHNIQUE: Sequential images of the abdomen were obtained out to 60 minutes following intravenous administration of radiopharmaceutical. RADIOPHARMACEUTICALS:  7.4 mCi Tc-2627m  Choletec IV COMPARISON:  None. FINDINGS: Prompt uptake and biliary excretion of activity by the liver is seen. Patient is status post cholecystectomy. Accumulation of biliary activity is seen in the gallbladder fossa, which then shows progressive extension into the perihepatic spaces, consistent with postop bile leak. No biliary activity reaches the small bowel, however patency of the common bile duct cannot be determined due to the large bile leak. IMPRESSION: Large post-op bile leak. No biliary activity reaches small bowel, however common bile duct patency cannot be determined due to the large bowel loop. These results will be called to the ordering clinician or representative by the Radiologist Assistant, and communication documented in the PACS or [employer]. Electronically Signed   By: Danae OrleansJohn A Stahl M.D.   On: 12/12/2020 15:20   US Abdomen Limited RUQ (LIVER/GB)  Result Date: 12/01/2020 CLINICAL DATA:  Epigastric pain EXAM: ULTRASOUND ABDOMEN LIMITED RIGHT UPPER QUADRANT COMPARISON:  None. FINDINGS: Gallbladder: Cholelithiasis including an 8 mm stone at the neck. No gallbladder wall thickening but there is focal tenderness by sonographer exam. No pericholecystic fluid. Common bile duct:  Diameter: 5 mm. Liver: Echogenic liver with diminished acoustic penetration. Portal vein is patent on color Doppler imaging with normal direction of blood flow towards the liver. Other: None. IMPRESSION: 1. Cholelithiasis including stone at the gallbladder neck. There is focal tenderness which could reflect gallbladder obstruction, but no wall thickening or edema typical of acute cholecystitis. 2. Hepatic steatosis. Electronically Signed   By: Marnee SpringJonathon  Watts M.D.   On: 12/01/2020 05:46      ASSESSMENT/PLAN    Acute febrile illness with hypoxemia -bibasilar atelectasis cannot rule out consolidation/infiltrate -on cipro will broaden to zosyn due to persistent fevers and trending up leucocytosis -respiratory infection workup thus far is negative, blood cultures in process.  -s/p HIDA -US abd complete in process- checking for nephrolithiasis -MRSA pcr-negative -REsp viral panel-negative - COVID19 negative - supplemental O2 during my evaluation 2L/min -legionella ab -strep pneumoniae ur AG-negative -sputum resp cultures -reviewed pertinent imaging with patient today -PT/OT for d/c planning  -US renal - mild ascites, GB surgical changes noted -LDH elevation - query due to pancreatitis/physical stress due to surgery    Acute Pancreatitis   - GB related   - note s/p ERCP -resolving - patient eating  -there is pleural effusion and atelectasis with abd asictes -    -good urine output and eating without nausea -LDH elevated    Opiod induced Constipation   s/p Dulcolax supposittory - +BM overnight   Bibasilar atelectasis  - Incentive spirometer - patient takes tidal volume 1500cc   -  patient will need MetaNEB with saline for recruitment      Thank you for allowing me to participate in the care of this patient.   Patient/Family are satisfied with care plan and all questions have been answered.  This document was prepared using Dragon voice recognition software and may include  unintentional dictation errors.     Vida Rigger, M.D.  Division of Pulmonary & Critical Care Medicine  [facility]

## 2020-12-18 NOTE — Progress Notes (Signed)
Order received from Dr Lemar Livings giving patient permission to leave the unit and to go outside

## 2020-12-18 NOTE — Consult Note (Signed)
NAME: Nicholas Gallagher  DOB: 08/05/1980  MRN: [id number]  Date/Time: 12/18/2020 12:35 PM  REQUESTING PROVIDER; Aleskerov Subjective:  REASON FOR CONSULT: fever, leucocytosis ? Nicholas BerryBrandon Gallagher is a 40 y.o. male with a history of acute on chronic cholecystitis and cholelithiasis causing abdominal pain for nearly 8 months and with increasing severe pain for the past 2 weeks underwent laparoscopic cholecystectomy on 12/11/2020 and was discharged at 5 PM the same day.  He returned to the ED the same night complaining of severe pain in the right upper quadrant which was constant.. Vitals in the ED BP of 173/113, pulse 112, temperature 97.9, sats of 99%. Labs revealed WBC of 12.3, Hb 16, platelets 662.  Creatinine 1.06, glucose 217, alkaline phosphatase 165, ALT 1214, AST 919, total bilirubin 3.1, lipase 31.  CT abdomen and pelvis revealed bilateral lower lobe consolidation consistent with atelectasis in the postsurgical setting.  There was trace perihepatic/infradiaphragmatic free fluid and foci of gas likely postsurgical in etiology.  He was admitted to the hospital under Dr. Lemar LivingsByrnett. He was started on ciprofloxacin.  He was seen by GI underwent ERCP on 12/12/2020..  A bile leak was found.  A biliary sphincterotomy was performed and a plastic stent was placed into the common bile duct.  Post procedure his pain was initially better 20 to get worse and he had pancreatitis as evidenced with increasing lipase and also the white count was increased.  He started spiking fever on 12/07/2018 2 repeat CT abdomen was done and it showed nonorganized gas and fluid collection in the gallbladder fossa measuring 4.4 into 2.3 cm.  There was acute edematous pancreatitis.  The CBD stent was in good position with no duct dilatation.  Multiple segmental atelectasis with small pleural effusion was seen.   He was seen by pulmonologist who switched antibiotic from ciprofloxacin to Zosyn on 12/15/2020.Marland Kitchen.  Leukocytosis improved from 32 to -24.2.   I am asked to see the patient for input into his fever and leukocytosis.   Past Medical History:  Diagnosis Date  . Gallstones     Past Surgical History:  Procedure Laterality Date  . CHOLECYSTECTOMY N/A 12/11/2020   Procedure: LAPAROSCOPIC CHOLECYSTECTOMY WITH INTRAOPERATIVE CHOLANGIOGRAM;  Surgeon: Earline MayotteByrnett, Jeffrey W, MD;  Location: [facility] ORS;  Service: General;  Laterality: N/A;  . ERCP N/A 12/12/2020   Procedure: ENDOSCOPIC RETROGRADE CHOLANGIOPANCREATOGRAPHY (ERCP);  Surgeon: Midge MiniumWohl, Darren, MD;  [facility];  Service: Endoscopy;  Laterality: N/A;  . NO PAST SURGERIES      Social History   Socioeconomic History  . Marital status: Married    Spouse name: Clydie BraunKaren  . Number of children: 2  . Years of education: Not on file  . Highest education level: Not on file  Occupational History  . Not on file  Tobacco Use  . Smoking status: Never Smoker  . Smokeless tobacco: Never Used  Vaping Use  . Vaping Use: Never used  Substance and Sexual Activity  . Alcohol use: Yes    Comment: occassional  . Drug use: Never  . Sexual activity: Yes  Other Topics Concern  . Not on file  Social History Narrative  . Not on file   Social Determinants of Health   Financial Resource Strain: Not on file  Food Insecurity: Not on file  Transportation Needs: Not on file  Physical Activity: Not on file  Stress: Not on file  Social Connections: Not on file  Intimate Partner Violence: Not on file    History reviewed. No pertinent family  history. No Known Allergies I? Current Facility-Administered Medications  Medication Dose Route Frequency Provider Last Rate Last Admin  . acetaminophen (TYLENOL) tablet 650 mg  650 mg Oral Q4H PRN Earline Mayotte, MD   650 mg at 12/17/20 2101  . bisacodyl (DULCOLAX) suppository 10 mg  10 mg Rectal Daily PRN Vida Rigger, MD   10 mg at 12/16/20 1423  . HYDROmorphone (DILAUDID) injection 0.5 mg  0.5 mg Intravenous Q3H PRN Earline Mayotte, MD       . iohexol (OMNIPAQUE) 9 MG/ML oral solution 500 mL  500 mL Oral Q1H Aleskerov, Fuad, MD   500 mL at 12/18/20 1215  . ketorolac (TORADOL) 30 MG/ML injection 30 mg  30 mg Intravenous Q8H Earline Mayotte, MD   30 mg at 12/18/20 1221  . metoCLOPramide (REGLAN) tablet 10 mg  10 mg Oral TID AC & HS Lemar Livings Merrily Pew, MD   10 mg at 12/18/20 0752  . ondansetron (ZOFRAN) injection 4 mg  4 mg Intravenous Q4H PRN Earline Mayotte, MD   4 mg at 12/14/20 1858  . oxyCODONE (Oxy IR/ROXICODONE) immediate release tablet 10 mg  10 mg Oral Q4H PRN Earline Mayotte, MD      . oxyCODONE (Oxy IR/ROXICODONE) immediate release tablet 5 mg  5 mg Oral Q4H PRN Earline Mayotte, MD      . piperacillin-tazobactam (ZOSYN) IVPB 3.375 g  3.375 g Intravenous Q8H Rauer, Robyne Peers, RPH 12.5 mL/hr at 12/18/20 0547 3.375 g at 12/18/20 0547     Abtx:  Anti-infectives (From admission, onward)   Start     Dose/Rate Route Frequency Ordered Stop   12/15/20 1400  piperacillin-tazobactam (ZOSYN) IVPB 3.375 g        3.375 g 12.5 mL/hr over 240 Minutes Intravenous Every 8 hours 12/15/20 1227     12/12/20 0330  ciprofloxacin (CIPRO) IVPB 400 mg  Status:  Discontinued        400 mg 200 mL/hr over 60 Minutes Intravenous Every 12 hours 12/12/20 0321 12/15/20 1215      REVIEW OF SYSTEMS:  Const:  fever, negative chills, negative weight loss Eyes: negative diplopia or visual changes, negative eye pain ENT: negative coryza, negative sore throat Resp: negative cough, hemoptysis, dyspnea Cards: negative for chest pain, palpitations, lower extremity edema GU: negative for frequency, dysuria and hematuria GI:as above Skin: negative for rash and pruritus Heme: negative for easy bruising and gum/nose bleeding MS: negative for myalgias, arthralgias, back pain and muscle weakness Neurolo:negative for headaches, dizziness, vertigo, memory problems  Psych: negative for feelings of anxiety, depression  Endocrine: negative for  thyroid, diabetes Allergy/Immunology- negative for any medication or food allergies ?  Objective:  VITALS:  BP (!) 140/94 (BP Location: Left Arm)   Pulse 86   Temp 98.3 F (36.8 C) (Oral)   Resp 18   Ht 6\' 1"  (1.854 m)   Wt 102.1 kg   SpO2 97%   BMI 29.70 kg/m  PHYSICAL EXAM:  General: Alert, cooperative, no distress, appears stated age.  Head: Normocephalic, without obvious abnormality, atraumatic. Eyes: Conjunctivae clear, anicteric sclerae. Pupils are equal ENT Nares normal. No drainage or sinus tenderness. Lips, mucosa, and tongue normal. No Thrush Neck: Supple, symmetrical, no adenopathy, thyroid: non tender no carotid bruit and no JVD. Back: No CVA tenderness. Lungs: Clear to auscultation bilaterally. No Wheezing or Rhonchi. No rales. Heart: Regular rate and rhythm, no murmur, rub or gallop. Abdomen: Soft, non-tender,not distended. Bowel sounds normal. No masses  Extremities: atraumatic, no cyanosis. No edema. No clubbing Skin: No rashes or lesions. Or bruising Lymph: Cervical, supraclavicular normal. Neurologic: Grossly non-focal Pertinent Labs Lab Results CBC    Component Value Date/Time   WBC 24.2 (H) 12/18/2020 0451   RBC 4.01 (L) 12/18/2020 0451   HGB 12.0 (L) 12/18/2020 0451   HCT 35.3 (L) 12/18/2020 0451   PLT 478 (H) 12/18/2020 0451   MCV 88.0 12/18/2020 0451   MCH 29.9 12/18/2020 0451   MCHC 34.0 12/18/2020 0451   RDW 12.7 12/18/2020 0451   LYMPHSABS 1.7 12/18/2020 0451   MONOABS 2.8 (H) 12/18/2020 0451   EOSABS 0.2 12/18/2020 0451   BASOSABS 0.1 12/18/2020 0451    CMP Latest Ref Rng & Units 12/18/2020 12/16/2020 12/15/2020  Glucose 70 - 99 mg/dL 283(M) 629(U) 765(Y)  BUN 6 - 20 mg/dL 8 9 7   Creatinine 0.61 - 1.24 mg/dL 6.50 3.54  Sodium 135 - 145 mmol/L 139 133(L) 130(L)  Potassium 3.5 - 5.1 mmol/L 3.4(L) 3.5 4.2  Chloride 98 - 111 mmol/L 102 96(L) 92(L)  CO2 22 - 32 mmol/L 27 26 27   Calcium 8.9 - 10.3 mg/dL 8.3(L) 8.2(L) 8.5(L)  Total  Protein 6.5 - 8.1 g/dL 6.2(L) 6.6 6.8  Total Bilirubin 0.3 - 1.2 mg/dL 6.56) ) 2.1(H)  Alkaline Phos 38 - 126 U/L 159(H) 117 126  AST 15 - 41 U/L 39 22 33  ALT 0 - 44 U/L 73(H) 125(H) 213(H)      Microbiology: Recent Results (from the past 240 hour(s))  Aerobic/Anaerobic Culture w Gram Stain (surgical/deep wound)     Status: None   Collection Time: 12/11/20  2:38 PM   Specimen: PATH Other; Tissue  Result Value Ref Range Status   Specimen Description   Final    WOUND Performed at [facility], [address], [place], [address] [place]    Special Requests   Final    GALLBLADDER Performed at [facility], [address] [place], [address] [place]    Gram Stain   Final    RARE WBC PRESENT, PREDOMINANTLY PMN NO ORGANISMS SEEN    Culture   Final    No growth aerobically or anaerobically. Performed at [facility], [address] [place], [address] [place]    Report Status 12/16/2020 FINAL  Final  Resp Panel by RT-PCR (Flu A&B, Covid) Nasopharyngeal Swab     Status: None   Collection Time: 12/12/20  2:37 AM   Specimen: Nasopharyngeal Swab; Nasopharyngeal(NP) swabs in vial transport medium  Result Value Ref Range Status   SARS Coronavirus 2 by RT PCR NEGATIVE NEGATIVE Final    Comment: (NOTE) SARS-CoV-2 target nucleic acids are NOT DETECTED.  The SARS-CoV-2 RNA is generally detectable in upper respiratory specimens during the acute phase of infection. The lowest concentration of SARS-CoV-2 viral copies this assay can detect is 138 copies/mL. A negative result does not preclude SARS-Cov-2 infection and should not be used as the sole basis for treatment or other patient management decisions. A negative result may occur with  improper specimen collection/handling, submission of specimen other than nasopharyngeal swab, presence of viral mutation(s) within the areas targeted by this assay, and inadequate number of viral copies(<138 copies/mL). A  negative result must be combined with clinical observations, patient history, and epidemiological information. The expected result is Negative.  Fact Sheet for Patients:  12/18/2020  Fact Sheet for Healthcare Providers:  12/14/20  This test is no t yet approved or cleared by  the [employer] and  has been authorized for detection and/or diagnosis of SARS-CoV-2 by FDA under an Emergency Use Authorization (EUA). This EUA will remain  in effect (meaning this test can be used) for the duration of the COVID-19 declaration under Section 564(b)(1) of the Act, 21 U.S.C.section 360bbb-3(b)(1), unless the authorization is terminated  or revoked sooner.       Influenza A by PCR NEGATIVE NEGATIVE Final   Influenza B by PCR NEGATIVE NEGATIVE Final    Comment: (NOTE) The Xpert Xpress SARS-CoV-2/FLU/RSV plus assay is intended as an aid in the diagnosis of influenza from Nasopharyngeal swab specimens and should not be used as a sole basis for treatment. Nasal washings and aspirates are unacceptable for Xpert Xpress SARS-CoV-2/FLU/RSV testing.  Fact Sheet for Patients: BloggerCourse.com  Fact Sheet for Healthcare Providers: SeriousBroker.it  This test is not yet approved or cleared by the [place] FDA and has been authorized for detection and/or diagnosis of SARS-CoV-2 by FDA under an Emergency Use Authorization (EUA). This EUA will remain in effect (meaning this test can be used) for the duration of the COVID-19 declaration under Section 564(b)(1) of the Act, 21 U.S.C. section 360bbb-3(b)(1), unless the authorization is terminated or revoked.  Performed at [facility], [address] [place], [place] [address]   Respiratory (~20 pathogens) panel by PCR     Status: None   Collection Time: 12/14/20  2:32 PM   Specimen: Nasopharyngeal Swab; Respiratory   Result Value Ref Range Status   Adenovirus NOT DETECTED NOT DETECTED Final   Coronavirus 229E NOT DETECTED NOT DETECTED Final    Comment: (NOTE) The Coronavirus on the Respiratory Panel, DOES NOT test for the novel  Coronavirus (2019 nCoV)    Coronavirus HKU1 NOT DETECTED NOT DETECTED Final   Coronavirus NL63 NOT DETECTED NOT DETECTED Final   Coronavirus OC43 NOT DETECTED NOT DETECTED Final   Metapneumovirus NOT DETECTED NOT DETECTED Final   Rhinovirus / Enterovirus NOT DETECTED NOT DETECTED Final   Influenza A NOT DETECTED NOT DETECTED Final   Influenza B NOT DETECTED NOT DETECTED Final   Parainfluenza Virus 1 NOT DETECTED NOT DETECTED Final   Parainfluenza Virus 2 NOT DETECTED NOT DETECTED Final   Parainfluenza Virus 3 NOT DETECTED NOT DETECTED Final   Parainfluenza Virus 4 NOT DETECTED NOT DETECTED Final   Respiratory Syncytial Virus NOT DETECTED NOT DETECTED Final   Bordetella pertussis NOT DETECTED NOT DETECTED Final   Bordetella Parapertussis NOT DETECTED NOT DETECTED Final   Chlamydophila pneumoniae NOT DETECTED NOT DETECTED Final   Mycoplasma pneumoniae NOT DETECTED NOT DETECTED Final    Comment: Performed at [facility], [address] [place], [place] [address]  MRSA PCR Screening     Status: None   Collection Time: 12/14/20  2:32 PM   Specimen: Nasal Mucosa; Nasopharyngeal  Result Value Ref Range Status   MRSA by PCR NEGATIVE NEGATIVE Final    Comment:        The GeneXpert MRSA Assay (FDA approved for NASAL specimens only), is one component of a comprehensive MRSA colonization surveillance program. It is not intended to diagnose MRSA infection nor to guide or monitor treatment for MRSA infections. Performed at [facility], [address] [place], [place] [address]   Culture, blood (x 2)     Status: None (Preliminary result)   Collection Time: 12/15/20 10:29 AM   Specimen: BLOOD  Result Value Ref Range Status   Specimen Description BLOOD  LEFT  [facility]  Final   Special Requests   Final    BOTTLES DRAWN AEROBIC AND ANAEROBIC Blood Culture adequate volume   Culture   Final    NO GROWTH 3 DAYS Performed at [facility], [address] [place], [place] [address]    Report Status PENDING  Incomplete  Culture, blood (x 2)     Status: None (Preliminary result)   Collection Time: 12/15/20  1:02 PM   Specimen: BLOOD  Result Value Ref Range Status   Specimen Description BLOOD LEFT ASSIST CONTROL  Final   Special Requests   Final    BOTTLES DRAWN AEROBIC AND ANAEROBIC Blood Culture results may not be optimal due to an inadequate volume of blood received in culture bottles   Culture   Final    NO GROWTH 3 DAYS Performed at [facility], [address], [place], [place] [address]    Report Status PENDING  Incomplete    IMAGING RESULTS:  I have personally reviewed the films ? Impression/Recommendation ? Acute on chronic cholecystitis and cholelithiasis status postcholecystectomy on 12/11/2020. ? Possible ascending cholangitis following cholecystectomy  Acute pancreatitis following ERCP  Biliary leak post surgery  Abnormal LFTs secondary to the above has resolved Fever and leukocytosis could be from ascending cholangitis and now compounded by the acute pancreatitis.  Currently on Zosyn.may be able to discontinue tomorrow  B/l atelectasis- no evidence clinically of pneumonia ___________________________________________________ Discussed with patient, and wife Note:  This document was prepared using Dragon voice recognition software and may include unintentional dictation errors.

## 2020-12-18 NOTE — Progress Notes (Signed)
Tmax 100.4,HR normal.  Pain well controlled on present RX w/ Toradol and PRN oxycodone. + BM yesterday. Lungs: Clear. Cardio: RR. ABD: Less distended, soft. No focal tenderness. Wounds: Clean. Calves: Soft. Labs:  WBC down to 24K, HGB up to 12.Lessleft shift. Platelets stable. LFT's improved, TB down to 1.3. Alb low at 2.5. Lytes notable for modest hypokalemia.  Discussed at length w/ Dr. Karna Christmas from pulmonary.  Will repeat CT of chest/abd/pelvis to assess lower lobe consolidation seen on yesterday's lateral CXR and a/f level in GB fossa.

## 2020-12-19 ENCOUNTER — Inpatient Hospital Stay: Payer: BC Managed Care – PPO

## 2020-12-19 LAB — CBC WITH DIFFERENTIAL/PLATELET
Abs Immature Granulocytes: 0.48 10*3/uL — ABNORMAL HIGH (ref 0.00–0.07)
Basophils Absolute: 0.1 10*3/uL (ref 0.0–0.1)
Basophils Relative: 1 %
Eosinophils Absolute: 0.2 10*3/uL (ref 0.0–0.5)
Eosinophils Relative: 1 %
HCT: 35.5 % — ABNORMAL LOW (ref 39.0–52.0)
Hemoglobin: 12.3 g/dL — ABNORMAL LOW (ref 13.0–17.0)
Immature Granulocytes: 2 %
Lymphocytes Relative: 8 %
Lymphs Abs: 2 10*3/uL (ref 0.7–4.0)
MCH: 30.3 pg (ref 26.0–34.0)
MCHC: 34.6 g/dL (ref 30.0–36.0)
MCV: 87.4 fL (ref 80.0–100.0)
Monocytes Absolute: 2.3 10*3/uL — ABNORMAL HIGH (ref 0.1–1.0)
Monocytes Relative: 9 %
Neutro Abs: 19.2 10*3/uL — ABNORMAL HIGH (ref 1.7–7.7)
Neutrophils Relative %: 79 %
Platelets: 607 10*3/uL — ABNORMAL HIGH (ref 150–400)
RBC: 4.06 MIL/uL — ABNORMAL LOW (ref 4.22–5.81)
RDW: 13 % (ref 11.5–15.5)
WBC: 24.3 10*3/uL — ABNORMAL HIGH (ref 4.0–10.5)
nRBC: 0 % (ref 0.0–0.2)

## 2020-12-19 LAB — COMPREHENSIVE METABOLIC PANEL
ALT: 74 U/L — ABNORMAL HIGH (ref 0–44)
AST: 46 U/L — ABNORMAL HIGH (ref 15–41)
Albumin: 2.6 g/dL — ABNORMAL LOW (ref 3.5–5.0)
Alkaline Phosphatase: 202 U/L — ABNORMAL HIGH (ref 38–126)
Anion gap: 12 (ref 5–15)
BUN: 8 mg/dL (ref 6–20)
CO2: 23 mmol/L (ref 22–32)
Calcium: 8.4 mg/dL — ABNORMAL LOW (ref 8.9–10.3)
Chloride: 103 mmol/L (ref 98–111)
Creatinine, Ser: 0.73 mg/dL (ref 0.61–1.24)
GFR, Estimated: >60 mL/min (ref >60)
Glucose, Bld: 148 mg/dL — ABNORMAL HIGH (ref 70–99)
Potassium: 3.3 mmol/L — ABNORMAL LOW (ref 3.5–5.1)
Sodium: 138 mmol/L (ref 135–145)
Total Bilirubin: 0.9 mg/dL (ref 0.3–1.2)
Total Protein: 6.7 g/dL (ref 6.5–8.1)

## 2020-12-19 LAB — PROTIME-INR
INR: 1.1 (ref 0.8–1.2)
Prothrombin Time: 13.8 seconds (ref 11.4–15.2)

## 2020-12-19 MED ORDER — POTASSIUM CHLORIDE CRYS ER 20 MEQ PO TBCR
20.0000 meq | EXTENDED_RELEASE_TABLET | Freq: Two times a day (BID) | ORAL | Status: DC
  Administered 2020-12-19 – 2020-12-21 (×4): 20 meq via ORAL
  Filled 2020-12-19 (×4): qty 1

## 2020-12-19 MED ORDER — HYDROCODONE-ACETAMINOPHEN 5-325 MG PO TABS: 1.0000 | ORAL_TABLET | ORAL | Status: DC | PRN

## 2020-12-19 MED ORDER — KETOROLAC TROMETHAMINE 15 MG/ML IJ SOLN
15.0000 mg | Freq: Three times a day (TID) | INTRAMUSCULAR | Status: DC
  Administered 2020-12-19 – 2020-12-20 (×4): 15 mg via INTRAVENOUS
  Filled 2020-12-19 (×4): qty 1

## 2020-12-19 MED ORDER — FENTANYL CITRATE (PF) 100 MCG/2ML IJ SOLN
INTRAMUSCULAR | Status: AC
  Filled 2020-12-19: qty 2

## 2020-12-19 MED ORDER — MIDAZOLAM HCL 5 MG/5ML IJ SOLN
INTRAMUSCULAR | Status: AC
  Filled 2020-12-19: qty 5

## 2020-12-19 MED ORDER — FENTANYL CITRATE (PF) 100 MCG/2ML IJ SOLN
INTRAMUSCULAR | Status: AC | PRN
  Administered 2020-12-19: 25 ug via INTRAVENOUS
  Administered 2020-12-19: 50 ug via INTRAVENOUS
  Administered 2020-12-19: 25 ug via INTRAVENOUS

## 2020-12-19 MED ORDER — SODIUM CHLORIDE 0.9% FLUSH
5.0000 mL | Freq: Three times a day (TID) | INTRAVENOUS | Status: DC
  Administered 2020-12-19 – 2020-12-21 (×5): 5 mL

## 2020-12-19 MED ORDER — MIDAZOLAM HCL 2 MG/2ML IJ SOLN
INTRAMUSCULAR | Status: AC | PRN
  Administered 2020-12-19: 1 mg via INTRAVENOUS
  Administered 2020-12-19: 2 mg via INTRAVENOUS

## 2020-12-19 NOTE — Progress Notes (Signed)
Patient clinically stable post Abscess drain placement per Dr Deanne Coffer, tolerated well, denies complaints at this time. Wife at bedside post procedure with return of patient to room, report given to [facility] RN at bedside post procedure/recovery. Patient awake/alert and oriented post procedure. Received Versed 3 mg along with Fentanyl 100 mcg IV for procedure.

## 2020-12-19 NOTE — Consult Note (Signed)
Chief Complaint: Patient was seen in consultation today for  Chief Complaint  Patient presents with  . Abdominal Pain    Referring Physician(s): Dr. Lemar Livings  Supervising Physician: Oley Balm  Patient Status: [facility] - In-pt  History of Present Illness: Nicholas Gallagher is a 40 y.o. male with no significant past medical history. He presented to the [facility] ED 12/01/20 with complaints of worsening RUQ pain and was found to have cholelithiasis without definitive evidence of obstruction; edema consistent with cholecystitis was present. The decision was made to discharge the patient with outpatient surgical follow up and on 12/11/20 he underwent an laparoscopic cholecystectomy with discharge home the same day. At the time of surgery he had evidence of severe acute on chronic cholecystitis and cholelithiasis.   He developed severe pain in the hours after he returned home and once again presented to the ED. Lab work was pertinent for increased bilirubin and LFTs and a large post-op bile leak was discovered on imaging studies. On 12/12/20 an ERCP with biliary sphincterotomy was performed and a plastic stent was placed into the common bile duct.   Shortly thereafter he became febrile with tachycardia and hypoxia. Additional imaging studies were ordered.    CT abdomen/pelvis with contrast 12/14/20 Hepatobiliary: No focal liver abnormality. Recent cholecystectomy. Gas and fluid collection without defined wall in the gallbladder fossa, 4.4 x 2.3 cm on axial slices. There is a stent in the CBD without ductal dilatation or malpositioning. A retention loop is seen at the level of the duodenum.   A repeat HIDA scan was done 12/15/20 and this was negative for a bile leak.   Interventional Radiology has been asked to evaluate this patient for a gallbladder fossa abscess aspiration with possible drain placement. This case has been reviewed and procedure approved by Dr. Deanne Coffer.   Past Medical History:   Diagnosis Date  . Gallstones     Past Surgical History:  Procedure Laterality Date  . CHOLECYSTECTOMY N/A 12/11/2020   Procedure: LAPAROSCOPIC CHOLECYSTECTOMY WITH INTRAOPERATIVE CHOLANGIOGRAM;  Surgeon: Earline Mayotte, MD;  Location: [facility] ORS;  Service: General;  Laterality: N/A;  . ERCP N/A 12/12/2020   Procedure: ENDOSCOPIC RETROGRADE CHOLANGIOPANCREATOGRAPHY (ERCP);  Surgeon: Midge Minium, MD;  Location: [facility] ENDOSCOPY;  Service: Endoscopy;  Laterality: N/A;  . NO PAST SURGERIES      Allergies: Patient has no known allergies.  Medications: Prior to Admission medications   Medication Sig Start Date End Date Taking? Authorizing Provider  Multiple Vitamins-Minerals (ADULT GUMMY PO) Take 2 capsules by mouth daily.    [provider]  naproxen sodium (ALEVE) 220 MG tablet Take 220 mg by mouth 2 (two) times daily as needed (pain).    [provider]  ondansetron (ZOFRAN ODT) 4 MG disintegrating tablet Take 1 tablet (4 mg total) by mouth every 6 (six) hours as needed for nausea or vomiting. Patient not taking: No sig reported 12/01/20   Ward, Layla Maw, DO  oxyCODONE-acetaminophen (PERCOCET) 5-325 MG tablet Take 2 tablets by mouth every 6 (six) hours as needed for severe pain. Patient not taking: No sig reported 12/01/20 12/01/21  Ward, Layla Maw, DO     History reviewed. No pertinent family history.  Social History   Socioeconomic History  . Marital status: Married    Spouse name: [name]  . Number of children: 2  . Years of education: Not on file  . Highest education level: Not on file  Occupational History  . Not on file  Tobacco Use  .  Smoking status: Never Smoker  . Smokeless tobacco: Never Used  Vaping Use  . Vaping Use: Never used  Substance and Sexual Activity  . Alcohol use: Yes    Comment: occassional  . Drug use: Never  . Sexual activity: Yes  Other Topics Concern  . Not on file  Social History Narrative  . Not on file   Social  Determinants of Health   Financial Resource Strain: Not on file  Food Insecurity: Not on file  Transportation Needs: Not on file  Physical Activity: Not on file  Stress: Not on file  Social Connections: Not on file    Review of Systems: A 12 point ROS discussed and pertinent positives are indicated in the HPI above.  All other systems are negative.  Review of Systems  Constitutional: Positive for appetite change and fatigue.  Respiratory: Negative for cough and shortness of breath.   Cardiovascular: Negative for chest pain and leg swelling.  Gastrointestinal: Positive for abdominal pain and diarrhea.  Neurological: Negative for dizziness and headaches.    Vital Signs: BP 135/85 (BP Location: Right Arm)   Pulse 89   Temp 98.1 F (36.7 C) (Oral)   Resp 17   Ht  (1.854 m)   Wt 225 lb 1.4 oz (102.1 kg)   SpO2 95%   BMI 29.70 kg/m   Physical Exam Constitutional:      General: He is not in acute distress. HENT:     Mouth/Throat:     Mouth: Mucous membranes are moist.     Pharynx: Oropharynx is clear.  Cardiovascular:     Rate and Rhythm: Normal rate and regular rhythm.     Pulses: Normal pulses.     Heart sounds: Normal heart sounds.  Pulmonary:     Effort: Pulmonary effort is normal.     Breath sounds: Normal breath sounds.  Abdominal:     General: There is distension.     Tenderness: There is abdominal tenderness.     Comments: Generalized abdominal tenderness. Laparoscopic incisions x 5 - Four are covered with steri-strips, one is within the umbilicus covered with skin glue.   Musculoskeletal:        General: Normal range of motion.     Right lower leg: No edema.     Left lower leg: No edema.  Skin:    General: Skin is warm and dry.  Neurological:     Mental Status: He is alert and oriented to person, place, and time.     Imaging: CT ABDOMEN PELVIS WO CONTRAST  Result Date: 12/12/2020 CLINICAL DATA:  Abdominal distension. Gallbladder removed today  with worsening abdominal pain and right neck pain. Postop day 0 EXAM: CT ABDOMEN AND PELVIS WITHOUT CONTRAST TECHNIQUE: Multidetector CT imaging of the abdomen and pelvis was performed following the standard protocol without IV contrast. COMPARISON:  Ultrasound abdomen 12/01/2020 FINDINGS: Lower chest: Bilateral lower lobe subsegmental atelectasis/consolidations, right greater than left. Hepatobiliary: No focal liver abnormality. Status post cholecystectomy. Trace fluid along the gallbladder fossa likely postsurgical. No biliary dilatation. Pancreas: No focal lesion. Normal pancreatic contour. No surrounding inflammatory changes. No main pancreatic ductal dilatation. Spleen: Normal in size without focal abnormality. Adrenals/Urinary Tract: No adrenal nodule bilaterally. No nephrolithiasis, no hydronephrosis, and no contour-deforming renal mass. No ureterolithiasis or hydroureter. The urinary bladder is unremarkable. Stomach/Bowel: Stomach is within normal limits. No evidence of bowel wall thickening or dilatation. Appendix appears normal. Vascular/Lymphatic: No significant vascular findings are present. No enlarged abdominal or pelvic lymph  nodes. Reproductive: Prostate is unremarkable. Other: Trace perihepatic/infra diaphragmatic free fluid and foci of gas. Trace free fluid within the pelvis. No organized fluid collection. Musculoskeletal: No abdominal wall hernia or abnormality. No suspicious lytic or blastic osseous lesions. No acute displaced fracture. IMPRESSION: 1. Bilateral lower lobe consolidations, right greater than left, likely represent atelectasis in the postsurgical setting peer 2. Trace perihepatic/infra-diaphragmatic free fluid and foci of gas likely postsurgical in etiology in the setting of post cholecystectomy. Electronically Signed   By: Tish FredericksonMorgane  Naveau M.D.   On: 12/12/2020 00:42   DG Chest 2 View  Result Date: 12/17/2020 CLINICAL DATA:  Post cholecystectomy complicated by development of a  bile leak. EXAM: CHEST - 2 VIEW COMPARISON:  12/15/2020; abdominal radiograph-earlier same day FINDINGS: Grossly unchanged cardiac silhouette and mediastinal contours. Unchanged trace left-sided effusion and associated bibasilar opacities, right greater than left. Mild pulmonary venous congestion without frank evidence of edema. No acute osseous abnormalities. Small amount of air is seen within the gallbladder fossa, better demonstrated on dedicated abdominal radiographs. Post cholecystectomy. Biliary stent overlies expected location of the CBD. IMPRESSION: 1. Similar findings of cardiomegaly, trace left-sided pleural effusion and bibasilar heterogeneous opacities, atelectasis versus infiltrate. 2. Pulmonary venous congestion without frank evidence of edema. 3. Small amount of air within the gallbladder fossa as better demonstrated on dedicated abdominal radiographs performed earlier same day. Again, further evaluation with abdominal CT could be performed as indicated. Electronically Signed   By: Simonne ComeJohn  Watts M.D.   On: 12/17/2020 09:16   US Abdomen Complete  Result Date: 12/15/2020 CLINICAL DATA:  Status post cholecystectomy several days ago with persistent abdominal pain EXAM: ABDOMEN ULTRASOUND COMPLETE COMPARISON:  CT from 12/14/2020, a paddle biliary scan from earlier in the same day. FINDINGS: Gallbladder: Gallbladder is been surgically removed. Air-fluid collection is noted within the gallbladder fossa measuring 4.6 x 2.3 x 2.2 cm. This is roughly similar to that seen on the prior CT examination and consistent with the post cholecystectomy state. Recent HIDA scan shows no evidence of biliary leak. Common bile duct: Diameter: 4 mm Liver: Increase in echogenicity consistent with fatty infiltration. No focal mass is noted. Portal vein is patent on color Doppler imaging with normal direction of blood flow towards the liver. IVC: No abnormality visualized. Pancreas: Not well visualized. Spleen: Size and  appearance within normal limits. Right Kidney: Length: 11.6 cm. Echogenicity within normal limits. No mass or hydronephrosis visualized. Left Kidney: Length: 10.9 cm. Echogenicity within normal limits. No mass or hydronephrosis visualized. Abdominal aorta: No aneurysm visualized. Other findings: Mild ascites is noted similar to that seen on recent CT. IMPRESSION: Postoperative fluid collection in the gallbladder fossa similar to that seen on CT from the previous day. Recent HIDA scan shows no evidence of biliary leak. Mild ascites. No other focal abnormality is noted. Electronically Signed   By: Alcide CleverMark  Lukens M.D.   On: 12/15/2020 15:39   CT ABDOMEN PELVIS W CONTRAST  Result Date: 12/14/2020 CLINICAL DATA:  Acute, severe pancreatitis EXAM: CT ABDOMEN AND PELVIS WITH CONTRAST TECHNIQUE: Multidetector CT imaging of the abdomen and pelvis was performed using the standard protocol following bolus administration of intravenous contrast. CONTRAST:  100mL OMNIPAQUE IOHEXOL 300 MG/ML  SOLN COMPARISON:  Abdominal MRI 12/12/2020 FINDINGS: Lower chest: Multi segment atelectasis at the lung bases with small pleural effusions. Hepatobiliary: No focal liver abnormality.Recent cholecystectomy. Gas and fluid collection without defined wall in the gallbladder fossa, 4.4 x 2.3 cm on axial slices. There is a stent in the  CBD without ductal dilatation or malpositioning. A retention loop is seen at the level of the duodenum. Pancreas: Peripancreatic edema and generalized mild pancreas expansion. No necrosis or pancreatic collection. No pancreatic ductal dilatation. Spleen: Unremarkable. Adrenals/Urinary Tract: Negative adrenals. No hydronephrosis or stone. Unremarkable bladder. Stomach/Bowel:  No obstruction. No appendicitis. Vascular/Lymphatic: No acute vascular abnormality. No mass or adenopathy. Reproductive:No pathologic findings. Other: No ascites or pneumoperitoneum.  Small volume ascites Musculoskeletal: No acute  abnormalities. IMPRESSION: 1. Non organized gas and fluid collection in the gallbladder fossa measuring up to 4.4 x 2.3 cm. 2. Acute edematous pancreatitis. The CBD stent is in good position with no ductal dilatation. 3. Multi segment atelectasis with small pleural effusions. Small volume non loculated ascites. Electronically Signed   By: Marnee Spring M.D.   On: 12/14/2020 10:30   CT CHEST ABDOMEN PELVIS W CONTRAST  Result Date: 12/18/2020 CLINICAL DATA:  Right upper quadrant pain, recent cholecystectomy. Pancreatitis. Common bile duct stent. EXAM: CT CHEST, ABDOMEN, AND PELVIS WITH CONTRAST TECHNIQUE: Multidetector CT imaging of the chest, abdomen and pelvis was performed following the standard protocol during bolus administration of intravenous contrast. CONTRAST:  OMNIPAQUE IOHEXOL 300 MG/ML  SOLN COMPARISON:  Ultrasound Dec 15, 2020, CT Dec 14, 2020 FINDINGS: CT CHEST FINDINGS Cardiovascular: No significant vascular findings. Normal heart size. Trace pericardial effusion, similar to prior. Mediastinum/Nodes: No discrete thyroid nodularity. No pathologically enlarged mediastinal, hilar or axillary lymph nodes. The trachea and esophagus are grossly unremarkable. Lungs/Pleura: Interval decrease in the small pleural effusions and bibasilar consolidations, with enhancement of the lung parenchyma suggestive of atelectasis as opposed to infiltrate. Musculoskeletal: No chest wall mass or suspicious bone lesions identified. CT ABDOMEN PELVIS FINDINGS Hepatobiliary: No suspicious hepatic lesion. The gallbladder is surgically absent. Similar size of the now walled off gas and fluid collection within the cholecystectomy bed measuring 4.2 x 2.5 cm previously 4.4 x 2.3 cm on image 60/2. Stent is again visualized within the common bile duct with pigtail loop in the duodenum. No biliary ductal dilation or pneumobilia. Pancreas: Slightly increased peripancreatic edema particularly along the pancreatic head with edema  extending along the retroperitoneum and mesenteric root. Slight edematous appearance to the pancreatic head. No evidence of pancreatic necrosis. No walled off peripancreatic collections. No pancreatic ductal dilation. Spleen: Unremarkable. Adrenals/Urinary Tract: Adrenal glands are unremarkable. Kidneys are normal, without renal calculi, focal lesion, or hydronephrosis. Bladder is unremarkable. Stomach/Bowel: Mild wall thickening with adjacent inflammation of the gastric antrum and proximal duodenum. No pathologic dilation of small bowel. There is mild colonic wall thickening and adjacent inflammatory stranding involving the splenic flexure of the colon and to a lesser extent the hepatic flexure of the colon. Radiopaque enteric contrast visualized to the level of the rectum. Vascular/Lymphatic: No abdominal aortic aneurysm. The SMV, portal vein and splenic veins are patent. Prominent periportal and mesenteric lymph nodes which are likely reactive. Reproductive: Prostate is unremarkable. Other: Trace pelvic free fluid.  No pneumoperitoneum. Musculoskeletal: No acute osseous abnormality. IMPRESSION: 1. Postsurgical change of cholecystectomy with CBD stent placement, without biliary ductal dilation. Similar size of the now walled off gas and fluid collection with the cholecystectomy bed measuring 4.2 cm, the sterility of which cannot be assessed on CT. 2. Slightly increased peripancreatic edema particularly along the pancreatic head with edema extending along the retroperitoneum and mesenteric root. Slight edematous appearance to the pancreatic head. No evidence of pancreatic necrosis. No walled off peripancreatic collections. Constellation of findings which favored to represent evolving pancreatitis although a superimposed  more acute component cannot be excluded by imaging. 3. Mild wall thickening with adjacent inflammation involving the gastric antrum, proximal duodenum, as well as the splenic and hepatic flexures,  which are favored reactive. 4. Interval decrease in the small pleural effusions and bibasilar consolidations, with enhancement of the lung parenchyma suggestive of atelectasis as opposed to infiltrate. Electronically Signed   By: Maudry Mayhew MD   On: 12/18/2020 15:28   DG Chest Port 1 View  Result Date: 12/15/2020 CLINICAL DATA:  40 year old male with recent cholecystectomy. EXAM: PORTABLE CHEST - 1 VIEW COMPARISON:  12/14/2020 FINDINGS: The mediastinal contours are within normal limits. No cardiomegaly. Low lung volumes. Similar appearing bibasilar subsegmental atelectasis. Trace left pleural effusion, unchanged. No pneumothorax. No acute osseous abnormality. IMPRESSION: Similar appearing bibasilar subsegmental atelectasis and trace left pleural effusion. Electronically Signed   By: Marliss Coots MD   On: 12/15/2020 09:38   DG Chest Port 1 View  Result Date: 12/14/2020 CLINICAL DATA:  Follow-up EXAM: PORTABLE CHEST 1 VIEW COMPARISON:  Dec 12, 2020 FINDINGS: The cardiomediastinal silhouette is unchanged in contour.Low lung volumes. Trace bilateral pleural effusion. No pneumothorax. Bibasilar linear opacities, consistent with atelectasis. These are mildly increased in comparison to prior. Visualized abdomen is unremarkable. No acute osseous abnormality. IMPRESSION: Bibasilar linear opacities, consistent with atelectasis. These are mildly increased in comparison to prior. Electronically Signed   By: Meda Klinefelter MD   On: 12/14/2020 15:12   DG Chest Portable 1 View  Result Date: 12/12/2020 CLINICAL DATA:  Short of breath. Pt had gall bladder removed today here for worsening abd pain and right neck pain. EXAM: PORTABLE CHEST 1 VIEW COMPARISON:  None. FINDINGS: Prominent cardiac silhouette likely due to AP portable technique as well as low lung volumes. The heart size and mediastinal contours are within normal limits. Low lung volumes with bibasilar atelectasis. No pulmonary edema. No pleural  effusion. No pneumothorax. No acute osseous abnormality. IMPRESSION: Low lung volumes with bibasilar atelectasis. Electronically Signed   By: Tish Frederickson M.D.   On: 12/12/2020 00:43   DG Abd 2 Views  Result Date: 12/17/2020 CLINICAL DATA:  Recent cholecystectomy complicated by development of a bile leak requiring biliary stent placement. EXAM: ABDOMEN - 2 VIEW COMPARISON:  Nuclear medicine HIDA scan-12/15/2020; 12/12/2020; ERCP-12/12/2020 FINDINGS: Ingested enteric contrast is seen throughout the colon. There is no significant gaseous distension of the upstream small bowel. Cholecystectomy clips overlie the right upper abdominal quadrant. A biliary stent overlies expected location of the CBD. Small amount of air is seen within the gallbladder fossa, nonspecific though favored to be secondary to a residual cystic duct leak in the setting of a biliary stent, less likely pneumoperitoneum given lack of free air seen below either hemidiaphragm. Limited visualization of the lower thorax demonstrates a trace left-sided effusion and bibasilar heterogeneous opacities. No acute osseous abnormalities. IMPRESSION: 1. Small amount of air within the gallbladder fossa favored to be secondary to residual cystic duct leak in the setting of a biliary stent, less likely pneumoperitoneum given lack of free air below either hemidiaphragm. Further evaluation with abdominal CT could be performed as indicated. 2. Enteric contrast seen throughout the colon. No evidence of enteric obstruction. 3. Trace left-sided and bibasilar opacities, atelectasis versus infiltrate. Electronically Signed   By: Simonne Come M.D.   On: 12/17/2020 09:14   DG C-Arm 1-60 Min-No Report  Result Date: 12/12/2020 Fluoroscopy was utilized by the requesting physician.  No radiographic interpretation.   MR ABDOMEN MRCP W WO CONTAST  Result Date: 12/12/2020 CLINICAL DATA:  Abdominal pain, suspected biliary duct obstruction. Post cholecystectomy. EXAM: MRI  ABDOMEN WITH AND WITHOUT CONTRAST (WITH MRCP) TECHNIQUE: Multiplanar multisequence MR imaging of the abdomen was performed prior to and following the administration of intravenous contrast. Heavily T2-weighted images of the biliary and pancreatic ducts were obtained, and three-dimensional MRCP images were rendered by post processing. CONTRAST:  10mL GADAVIST GADOBUTROL 1 MMOL/ML IV SOLN COMPARISON:  CT abdomen and pelvis of Dec 12, 2020. FINDINGS: Lower chest: Dense basilar airspace disease at the RIGHT lung base with similar appearance to prior imaging obtained on the same date. Also with LEFT lower lobe airspace disease. Hepatobiliary: Perihepatic ascites. Small amount of fluid in the gallbladder fossa. The common bile duct is of normal caliber. No sign of filling defect in the biliary tree. No focal, suspicious hepatic lesion. Portal vein is patent. Hepatic veins are patent. On MRCP sequences fluid can be seen tracking from the gallbladder fossa incontinuity with small to moderate volume of perihepatic fluid. There is also interloop fluid in the upper abdomen adjacent to the RIGHT colon. This is best seen on T2 weighted imaging on image 37 of series 40 with respect to interloop fluid. Pancreas:  Normal, without mass, inflammation or ductal dilatation. Spleen:  Normal spleen. Adrenals/Urinary Tract: Adrenal glands are normal. Symmetric renal enhancement. No hydronephrosis. Stomach/Bowel: No sign of acute gastrointestinal process to the extent evaluated on abdominal MRI. Vascular/Lymphatic: Patent abdominal vessels. There is no gastrohepatic or hepatoduodenal ligament lymphadenopathy. No retroperitoneal or mesenteric lymphadenopathy. Other: Perihepatic ascites and fluid tracking into the RIGHT upper quadrant in communication with fluid about the gallbladder fossa. Small amount of pneumoperitoneum as on the previous CT anterior to the liver margin. Musculoskeletal: No suspicious bone lesions identified. IMPRESSION:  Small to moderate volume of perihepatic ascites slightly increased since the previous study. There is also some fluid and or small amount of blood inferior to the RIGHT hemi liver. While constellation of findings could be seen in the postoperative setting the slight interval increase in fluid about the liver, in communication with the gallbladder fossa in the setting of increasing bilirubin raises suspicion for biliary leak. Nuclear medicine hepatobiliary scan may be helpful to exclude the possibility of biliary leak. Dense basilar consolidative changes RIGHT greater than LEFT persist likely related to volume loss, difficult to exclude the possibility of developing infection, attention on follow-up. These results will be called to the ordering clinician or representative by the Radiologist Assistant, and communication documented in the PACS or [employer]. Electronically Signed   By: Donzetta Kohut M.D.   On: 12/12/2020 11:41   NM HEPATOBILIARY LEAK (POST-SURGICAL)  Result Date: 12/15/2020 CLINICAL DATA:  Status post cholecystectomy, complicated by bile leak, status post ERCP stent placement EXAM: NUCLEAR MEDICINE HEPATOBILIARY IMAGING TECHNIQUE: Sequential images of the abdomen were obtained out to 60 minutes following intravenous administration of radiopharmaceutical. RADIOPHARMACEUTICALS:  7.95 mCi Tc-100m  Choletec IV COMPARISON:  12/12/2020 FINDINGS: Prompt uptake and biliary excretion of activity by the liver is seen. Gallbladder is surgically absent. Biliary activity passes into small bowel, consistent with patent common bile duct. No evidence of extraluminal radiotracer accumulation. IMPRESSION: 1.  Negative examination for bile leak status post cholecystectomy. 2.  The common bile duct is patent. Electronically Signed   By: Lauralyn Primes M.D.   On: 12/15/2020 12:29   NM HEPATOBILIARY LEAK (POST-SURGICAL)  Result Date: 12/12/2020 CLINICAL DATA:  Status post cholecystectomy. Perihepatic fluid on  recent MRI. Evaluate for postop bile leak.  EXAM: NUCLEAR MEDICINE HEPATOBILIARY IMAGING TECHNIQUE: Sequential images of the abdomen were obtained out to 60 minutes following intravenous administration of radiopharmaceutical. RADIOPHARMACEUTICALS:  7.4 mCi Tc-24m  Choletec IV COMPARISON:  None. FINDINGS: Prompt uptake and biliary excretion of activity by the liver is seen. Patient is status post cholecystectomy. Accumulation of biliary activity is seen in the gallbladder fossa, which then shows progressive extension into the perihepatic spaces, consistent with postop bile leak. No biliary activity reaches the small bowel, however patency of the common bile duct cannot be determined due to the large bile leak. IMPRESSION: Large post-op bile leak. No biliary activity reaches small bowel, however common bile duct patency cannot be determined due to the large bowel loop. These results will be called to the ordering clinician or representative by the Radiologist Assistant, and communication documented in the PACS or [employer]. Electronically Signed   By: Danae Orleans M.D.   On: 12/12/2020 15:20   US Abdomen Limited RUQ (LIVER/GB)  Result Date: 12/01/2020 CLINICAL DATA:  Epigastric pain EXAM: ULTRASOUND ABDOMEN LIMITED RIGHT UPPER QUADRANT COMPARISON:  None. FINDINGS: Gallbladder: Cholelithiasis including an 8 mm stone at the neck. No gallbladder wall thickening but there is focal tenderness by sonographer exam. No pericholecystic fluid. Common bile duct: Diameter: 5 mm. Liver: Echogenic liver with diminished acoustic penetration. Portal vein is patent on color Doppler imaging with normal direction of blood flow towards the liver. Other: None. IMPRESSION: 1. Cholelithiasis including stone at the gallbladder neck. There is focal tenderness which could reflect gallbladder obstruction, but no wall thickening or edema typical of acute cholecystitis. 2. Hepatic steatosis. Electronically Signed   By: Marnee Spring  M.D.   On: 12/01/2020 05:46    Labs:  CBC: Recent Labs    12/15/20 0617 12/16/20 0640 12/17/20 0601 12/18/20 0451  WBC 32.2* 27.4* 28.9* 24.2*  HGB 13.7 12.1* 11.9* 12.0*  HCT 39.5 35.3* 34.8* 35.3*  PLT 417* 399 436* 478*    COAGS: No results for input(s): INR, APTT in the last 8760 hours.  BMP: Recent Labs    12/14/20 0544 12/15/20 0617 12/16/20 0640 12/18/20 0451  NA 134* 130* 133* 139  K 3.7 4.2 3.5 3.4*  CL 97* 92* 96* 102  CO2 28 27 26 27   GLUCOSE 144* 138* 121* 111*  BUN 12 7 9 8   CALCIUM 8.4* 8.5* 8.2* 8.3*  CREATININE 0.85 0.89 0.73 0.82  GFRNONAA >60 >60 >60 >60    LIVER FUNCTION TESTS: Recent Labs    12/14/20 0544 12/15/20 0617 12/16/20 0640 12/18/20 0451  BILITOT 1.9* 2.1* 1.7* 1.3*  AST 49* 33 22 39  ALT 349* 213* 125* 73*  ALKPHOS 121 126 117 159*  PROT 6.6 6.8 6.6 6.2*  ALBUMIN 3.4* 3.1* 2.8* 2.5*    TUMOR MARKERS: No results for input(s): AFPTM, CEA, CA199, CHROMGRNA in the last 8760 hours.  Assessment and Plan:  Recent cholecystectomy; gallbladder fossa abscess: 12/18/20, 40 year old male, is tentatively scheduled this afternoon for an image-guided percutaneous gallbladder fossa abscess with possible drain placement. He has been NPO since 0800 today and he is not taking any blood-thinning medications.  Risks and benefits discussed with the patient including bleeding, infection, damage to adjacent structures, bowel perforation/fistula connection, and sepsis.  All of the patient's questions were answered, patient is agreeable to proceed.  Consent signed and in chart.  Thank you for this interesting consult.  I greatly enjoyed meeting Kermitt Harjo and look forward to participating in their care.  A copy  of this report was sent to the requesting provider on this date.  Electronically Signed: Alwyn Ren, AGACNP-BC [phone] 12/19/2020, 9:57 AM   I spent a total of 20 Minutes    in face to face in clinical consultation,  greater than 50% of which was counseling/coordinating care for gallbladder fossa abscess aspiration with drain placement.

## 2020-12-19 NOTE — Procedures (Signed)
  Procedure: CT guided RUQ drain placement 76ml blood tinged bilious for GS,  C&S EBL:   minimal Complications:  none immediate  See full dictation in [employer].  Thora Lance MD Main # [phone] Pager  [phone] Mobile [phone]

## 2020-12-19 NOTE — Progress Notes (Signed)
Tolerated procedure well. Awaiting gram stain results. Ambulating. Recheck CBC in AM.  Likely d/c antibiotics tomorrow if gram stain clear.

## 2020-12-19 NOTE — Progress Notes (Signed)
Pulmonary Progress Note         Date: 12/19/2020,   MRN# [id number] Nicholas Gallagher 07/18/1981     AdmissionWeight: 102.1 kg                 CurrentWeight: 102.1 kg   Referring physician: Dr Lemar Livings   CHIEF COMPLAINT:   Febrile illness with abnormal chest imaging   HISTORY OF PRESENT ILLNESS    This 40 year old male s/p lap chole 5/25, he does not have baseline medical problems and generally does not take medication. He had abd discomfort with flatus post operatively.  He has developed some pain and noted to have shallow breathing.   He was noted to have fever >102 and tachycardia.  He had  CT imaging done which I reviewed personally.   Labwork significant for leukocytosis and transaminitis, with improved elevation of tbili.  He is urinating well and BP is stable.     12/15/20- patient with trending up leukocytosis. Had HIDA today.  S/p lasix with diuresis.  He received metaneb and atelectasis appears improved upon review of interval chest x rays.  Hepating function panel is improved, lipase is trending down. He is ambulating well on his own. He was febrile overnight Tmax >102 and required Oxygen 2L/min [place].  Patient shares he feels better today.   12/16/20- patient reports "I turned the corner" able to lay in bed in supine position and sleep without pain. He is eating , no BM.  Blood work with further improved transaminitis and leucocytosis with improved numbers . PCT trending up.    12/17/20- patient is improved.  He had cxr this am which is mildy improved bilaterally.  WBC count with downtrend. He is on room air, reports improved resp status. UOP .   12/18/2020- patient is improved, he feels abd pain is better. His blood work has improved with trending down WBC count.  He has imaging scheduled for today. Ive discussed care plan with patient and wife as well as surgery and ID team. He continues to do self PT.   12/19/20- patient is on room air. He had GB drain placed  with cultures fluid in process. He is eating with good bowel sounds in all 4 quadrants. Respiratory status is stable.     PAST MEDICAL HISTORY   Past Medical History:  Diagnosis Date  . Gallstones      SURGICAL HISTORY   Past Surgical History:  Procedure Laterality Date  . CHOLECYSTECTOMY N/A 12/11/2020   Procedure: LAPAROSCOPIC CHOLECYSTECTOMY WITH INTRAOPERATIVE CHOLANGIOGRAM;  Surgeon: Earline Mayotte, MD;  Location: [facility] ORS;  Service: General;  Laterality: N/A;  . ERCP N/A 12/12/2020   Procedure: ENDOSCOPIC RETROGRADE CHOLANGIOPANCREATOGRAPHY (ERCP);  Surgeon: Midge Minium, MD;  Location: [facility] ENDOSCOPY;  Service: Endoscopy;  Laterality: N/A;  . NO PAST SURGERIES       FAMILY HISTORY   History reviewed. No pertinent family history.   SOCIAL HISTORY   Social History   Tobacco Use  . Smoking status: Never Smoker  . Smokeless tobacco: Never Used  Vaping Use  . Vaping Use: Never used  Substance Use Topics  . Alcohol use: Yes    Comment: occassional  . Drug use: Never     MEDICATIONS    Home Medication:    Current Medication:  Current Facility-Administered Medications:  .  acetaminophen (TYLENOL) tablet 650 mg, 650 mg, Oral, Q4H PRN, Earline Mayotte, MD, 650 mg at 12/17/20 2101 .  bisacodyl (DULCOLAX) suppository 10 mg,  10 mg, Rectal, Daily PRN, Vida Rigger, MD, 10 mg at 12/16/20 1423 .  HYDROcodone-acetaminophen (NORCO/VICODIN) 5-325 MG per tablet 1-2 tablet, 1-2 tablet, Oral, Q4H PRN, Oley Balm, MD .  HYDROmorphone (DILAUDID) injection 0.5 mg, 0.5 mg, Intravenous, Q3H PRN, Earline Mayotte, MD .  ketorolac (TORADOL) 15 MG/ML injection 15 mg, 15 mg, Intravenous, Q8H, [name], Merrily Pew, MD .  metoCLOPramide (REGLAN) tablet 10 mg, 10 mg, Oral, TID AC & HS, [name], Merrily Pew, MD, 10 mg at 12/19/20 0859 .  ondansetron (ZOFRAN) injection 4 mg, 4 mg, Intravenous, Q4H PRN, Earline Mayotte, MD, 4 mg at 12/14/20 1858 .  oxyCODONE (Oxy  IR/ROXICODONE) immediate release tablet 10 mg, 10 mg, Oral, Q4H PRN, Earline Mayotte, MD .  oxyCODONE (Oxy IR/ROXICODONE) immediate release tablet 5 mg, 5 mg, Oral, Q4H PRN, Earline Mayotte, MD, 5 mg at 12/19/20 1610 .  piperacillin-tazobactam (ZOSYN) IVPB 3.375 g, 3.375 g, Intravenous, Q8H, Rauer, Samantha O, RPH, Last Rate: 12.5 mL/hr at 12/19/20 1543, 3.375 g at 12/19/20 1543 .  sodium chloride flush (NS) 0.9 % injection 5 mL, 5 mL, Intracatheter, Q8H, Oley Balm, MD    ALLERGIES   Patient has no known allergies.     REVIEW OF SYSTEMS    Review of Systems:  Gen:  Denies  fever, sweats, chills weigh loss  HEENT: Denies blurred vision, double vision, ear pain, eye pain, hearing loss, nose bleeds, sore throat Cardiac:  No dizziness, chest pain or heaviness, chest tightness,edema Resp:   Denies cough or sputum porduction, shortness of breath,wheezing, hemoptysis,  Gi: +abd pain improved  Gu:  Denies bladder incontinence, burning urine Ext:   Denies Joint pain, stiffness or swelling Skin: Denies  skin rash, easy bruising or bleeding or hives Endoc:  Denies polyuria, polydipsia , polyphagia or weight change Psych:   Denies depression, insomnia or hallucinations   Other:  All other systems negative   VS: BP 138/78   Pulse 96   Temp 100 F (37.8 C) (Oral)   Resp 20   Ht  (1.854 m)   Wt 102.1 kg   SpO2 94%   BMI 29.70 kg/m      PHYSICAL EXAM    GENERAL:NAD, no fevers, chills, no weakness no fatigue HEAD: Normocephalic, atraumatic.  EYES: Pupils equal, round, reactive to light. Extraocular muscles intact. No scleral icterus.  MOUTH: Moist mucosal membrane. Dentition intact. No abscess noted.  EAR, NOSE, THROAT: Clear without exudates. No external lesions.  NECK: Supple. No thyromegaly. No nodules. No JVD.  PULMONARY: bilateral clear to auscultation CARDIOVASCULAR: S1 and S2. Regular rate and rhythm. No murmurs, rubs, or gallops. No edema. Pedal pulses  2+ bilaterally.  GASTROINTESTINAL: +abd tenderness MUSCULOSKELETAL: No swelling, clubbing, or edema. Range of motion full in all extremities.  NEUROLOGIC: Cranial nerves II through XII are intact. No gross focal neurological deficits. Sensation intact. Reflexes intact.  SKIN: No ulceration, lesions, rashes, or cyanosis. Skin warm and dry. Turgor intact.  PSYCHIATRIC: Mood, affect within normal limits. The patient is awake, alert and oriented x 3. Insight, judgment intact.       IMAGING       CT ABDOMEN PELVIS WO CONTRAST  Result Date: 12/12/2020 CLINICAL DATA:  Abdominal distension. Gallbladder removed today with worsening abdominal pain and right neck pain. Postop day 0 EXAM: CT ABDOMEN AND PELVIS WITHOUT CONTRAST TECHNIQUE: Multidetector CT imaging of the abdomen and pelvis was performed following the standard protocol without IV contrast. COMPARISON:  Ultrasound abdomen 12/01/2020  FINDINGS: Lower chest: Bilateral lower lobe subsegmental atelectasis/consolidations, right greater than left. Hepatobiliary: No focal liver abnormality. Status post cholecystectomy. Trace fluid along the gallbladder fossa likely postsurgical. No biliary dilatation. Pancreas: No focal lesion. Normal pancreatic contour. No surrounding inflammatory changes. No main pancreatic ductal dilatation. Spleen: Normal in size without focal abnormality. Adrenals/Urinary Tract: No adrenal nodule bilaterally. No nephrolithiasis, no hydronephrosis, and no contour-deforming renal mass. No ureterolithiasis or hydroureter. The urinary bladder is unremarkable. Stomach/Bowel: Stomach is within normal limits. No evidence of bowel wall thickening or dilatation. Appendix appears normal. Vascular/Lymphatic: No significant vascular findings are present. No enlarged abdominal or pelvic lymph nodes. Reproductive: Prostate is unremarkable. Other: Trace perihepatic/infra diaphragmatic free fluid and foci of gas. Trace free fluid within the pelvis.  No organized fluid collection. Musculoskeletal: No abdominal wall hernia or abnormality. No suspicious lytic or blastic osseous lesions. No acute displaced fracture. IMPRESSION: 1. Bilateral lower lobe consolidations, right greater than left, likely represent atelectasis in the postsurgical setting peer 2. Trace perihepatic/infra-diaphragmatic free fluid and foci of gas likely postsurgical in etiology in the setting of post cholecystectomy. Electronically Signed   By: Tish Frederickson M.D.   On: 12/12/2020 00:42   DG Chest 2 View  Result Date: 12/17/2020 CLINICAL DATA:  Post cholecystectomy complicated by development of a bile leak. EXAM: CHEST - 2 VIEW COMPARISON:  12/15/2020; abdominal radiograph-earlier same day FINDINGS: Grossly unchanged cardiac silhouette and mediastinal contours. Unchanged trace left-sided effusion and associated bibasilar opacities, right greater than left. Mild pulmonary venous congestion without frank evidence of edema. No acute osseous abnormalities. Small amount of air is seen within the gallbladder fossa, better demonstrated on dedicated abdominal radiographs. Post cholecystectomy. Biliary stent overlies expected location of the CBD. IMPRESSION: 1. Similar findings of cardiomegaly, trace left-sided pleural effusion and bibasilar heterogeneous opacities, atelectasis versus infiltrate. 2. Pulmonary venous congestion without frank evidence of edema. 3. Small amount of air within the gallbladder fossa as better demonstrated on dedicated abdominal radiographs performed earlier same day. Again, further evaluation with abdominal CT could be performed as indicated. Electronically Signed   By: Simonne Come M.D.   On: 12/17/2020 09:16   US Abdomen Complete  Result Date: 12/15/2020 CLINICAL DATA:  Status post cholecystectomy several days ago with persistent abdominal pain EXAM: ABDOMEN ULTRASOUND COMPLETE COMPARISON:  CT from 12/14/2020, a paddle biliary scan from earlier in the same day.  FINDINGS: Gallbladder: Gallbladder is been surgically removed. Air-fluid collection is noted within the gallbladder fossa measuring 4.6 x 2.3 x 2.2 cm. This is roughly similar to that seen on the prior CT examination and consistent with the post cholecystectomy state. Recent HIDA scan shows no evidence of biliary leak. Common bile duct: Diameter: 4 mm Liver: Increase in echogenicity consistent with fatty infiltration. No focal mass is noted. Portal vein is patent on color Doppler imaging with normal direction of blood flow towards the liver. IVC: No abnormality visualized. Pancreas: Not well visualized. Spleen: Size and appearance within normal limits. Right Kidney: Length: 11.6 cm. Echogenicity within normal limits. No mass or hydronephrosis visualized. Left Kidney: Length: 10.9 cm. Echogenicity within normal limits. No mass or hydronephrosis visualized. Abdominal aorta: No aneurysm visualized. Other findings: Mild ascites is noted similar to that seen on recent CT. IMPRESSION: Postoperative fluid collection in the gallbladder fossa similar to that seen on CT from the previous day. Recent HIDA scan shows no evidence of biliary leak. Mild ascites. No other focal abnormality is noted. Electronically Signed   By: Eulah Pont.D.  On: 12/15/2020 15:39   CT ABDOMEN PELVIS W CONTRAST  Result Date: 12/14/2020 CLINICAL DATA:  Acute, severe pancreatitis EXAM: CT ABDOMEN AND PELVIS WITH CONTRAST TECHNIQUE: Multidetector CT imaging of the abdomen and pelvis was performed using the standard protocol following bolus administration of intravenous contrast. CONTRAST:  100mL OMNIPAQUE IOHEXOL 300 MG/ML  SOLN COMPARISON:  Abdominal MRI 12/12/2020 FINDINGS: Lower chest: Multi segment atelectasis at the lung bases with small pleural effusions. Hepatobiliary: No focal liver abnormality.Recent cholecystectomy. Gas and fluid collection without defined wall in the gallbladder fossa, 4.4 x 2.3 cm on axial slices. There is a stent  in the CBD without ductal dilatation or malpositioning. A retention loop is seen at the level of the duodenum. Pancreas: Peripancreatic edema and generalized mild pancreas expansion. No necrosis or pancreatic collection. No pancreatic ductal dilatation. Spleen: Unremarkable. Adrenals/Urinary Tract: Negative adrenals. No hydronephrosis or stone. Unremarkable bladder. Stomach/Bowel:  No obstruction. No appendicitis. Vascular/Lymphatic: No acute vascular abnormality. No mass or adenopathy. Reproductive:No pathologic findings. Other: No ascites or pneumoperitoneum.  Small volume ascites Musculoskeletal: No acute abnormalities. IMPRESSION: 1. Non organized gas and fluid collection in the gallbladder fossa measuring up to 4.4 x 2.3 cm. 2. Acute edematous pancreatitis. The CBD stent is in good position with no ductal dilatation. 3. Multi segment atelectasis with small pleural effusions. Small volume non loculated ascites. Electronically Signed   By: Marnee SpringJonathon  Watts M.D.   On: 12/14/2020 10:30   CT CHEST ABDOMEN PELVIS W CONTRAST  Result Date: 12/18/2020 CLINICAL DATA:  Right upper quadrant pain, recent cholecystectomy. Pancreatitis. Common bile duct stent. EXAM: CT CHEST, ABDOMEN, AND PELVIS WITH CONTRAST TECHNIQUE: Multidetector CT imaging of the chest, abdomen and pelvis was performed following the standard protocol during bolus administration of intravenous contrast. CONTRAST:  100mL OMNIPAQUE IOHEXOL 300 MG/ML  SOLN COMPARISON:  Ultrasound Dec 15, 2020, CT Dec 14, 2020 FINDINGS: CT CHEST FINDINGS Cardiovascular: No significant vascular findings. Normal heart size. Trace pericardial effusion, similar to prior. Mediastinum/Nodes: No discrete thyroid nodularity. No pathologically enlarged mediastinal, hilar or axillary lymph nodes. The trachea and esophagus are grossly unremarkable. Lungs/Pleura: Interval decrease in the small pleural effusions and bibasilar consolidations, with enhancement of the lung parenchyma  suggestive of atelectasis as opposed to infiltrate. Musculoskeletal: No chest wall mass or suspicious bone lesions identified. CT ABDOMEN PELVIS FINDINGS Hepatobiliary: No suspicious hepatic lesion. The gallbladder is surgically absent. Similar size of the now walled off gas and fluid collection within the cholecystectomy bed measuring 4.2 x 2.5 cm previously 4.4 x 2.3 cm on image 60/2. Stent is again visualized within the common bile duct with pigtail loop in the duodenum. No biliary ductal dilation or pneumobilia. Pancreas: Slightly increased peripancreatic edema particularly along the pancreatic head with edema extending along the retroperitoneum and mesenteric root. Slight edematous appearance to the pancreatic head. No evidence of pancreatic necrosis. No walled off peripancreatic collections. No pancreatic ductal dilation. Spleen: Unremarkable. Adrenals/Urinary Tract: Adrenal glands are unremarkable. Kidneys are normal, without renal calculi, focal lesion, or hydronephrosis. Bladder is unremarkable. Stomach/Bowel: Mild wall thickening with adjacent inflammation of the gastric antrum and proximal duodenum. No pathologic dilation of small bowel. There is mild colonic wall thickening and adjacent inflammatory stranding involving the splenic flexure of the colon and to a lesser extent the hepatic flexure of the colon. Radiopaque enteric contrast visualized to the level of the rectum. Vascular/Lymphatic: No abdominal aortic aneurysm. The SMV, portal vein and splenic veins are patent. Prominent periportal and mesenteric lymph nodes which are likely reactive.  Reproductive: Prostate is unremarkable. Other: Trace pelvic free fluid.  No pneumoperitoneum. Musculoskeletal: No acute osseous abnormality. IMPRESSION: 1. Postsurgical change of cholecystectomy with CBD stent placement, without biliary ductal dilation. Similar size of the now walled off gas and fluid collection with the cholecystectomy bed measuring 4.2 cm, the  sterility of which cannot be assessed on CT. 2. Slightly increased peripancreatic edema particularly along the pancreatic head with edema extending along the retroperitoneum and mesenteric root. Slight edematous appearance to the pancreatic head. No evidence of pancreatic necrosis. No walled off peripancreatic collections. Constellation of findings which favored to represent evolving pancreatitis although a superimposed more acute component cannot be excluded by imaging. 3. Mild wall thickening with adjacent inflammation involving the gastric antrum, proximal duodenum, as well as the splenic and hepatic flexures, which are favored reactive. 4. Interval decrease in the small pleural effusions and bibasilar consolidations, with enhancement of the lung parenchyma suggestive of atelectasis as opposed to infiltrate. Electronically Signed   By: Maudry Mayhew MD   On: 12/18/2020 15:28   DG Chest Port 1 View  Result Date: 12/15/2020 CLINICAL DATA:  40 year old male with recent cholecystectomy. EXAM: PORTABLE CHEST - 1 VIEW COMPARISON:  12/14/2020 FINDINGS: The mediastinal contours are within normal limits. No cardiomegaly. Low lung volumes. Similar appearing bibasilar subsegmental atelectasis. Trace left pleural effusion, unchanged. No pneumothorax. No acute osseous abnormality. IMPRESSION: Similar appearing bibasilar subsegmental atelectasis and trace left pleural effusion. Electronically Signed   By: Marliss Coots MD   On: 12/15/2020 09:38   DG Chest Port 1 View  Result Date: 12/14/2020 CLINICAL DATA:  Follow-up EXAM: PORTABLE CHEST 1 VIEW COMPARISON:  Dec 12, 2020 FINDINGS: The cardiomediastinal silhouette is unchanged in contour.Low lung volumes. Trace bilateral pleural effusion. No pneumothorax. Bibasilar linear opacities, consistent with atelectasis. These are mildly increased in comparison to prior. Visualized abdomen is unremarkable. No acute osseous abnormality. IMPRESSION: Bibasilar linear opacities,  consistent with atelectasis. These are mildly increased in comparison to prior. Electronically Signed   By: Meda Klinefelter MD   On: 12/14/2020 15:12   DG Chest Portable 1 View  Result Date: 12/12/2020 CLINICAL DATA:  Short of breath. Pt had gall bladder removed today here for worsening abd pain and right neck pain. EXAM: PORTABLE CHEST 1 VIEW COMPARISON:  None. FINDINGS: Prominent cardiac silhouette likely due to AP portable technique as well as low lung volumes. The heart size and mediastinal contours are within normal limits. Low lung volumes with bibasilar atelectasis. No pulmonary edema. No pleural effusion. No pneumothorax. No acute osseous abnormality. IMPRESSION: Low lung volumes with bibasilar atelectasis. Electronically Signed   By: Tish Frederickson M.D.   On: 12/12/2020 00:43   DG Abd 2 Views  Result Date: 12/17/2020 CLINICAL DATA:  Recent cholecystectomy complicated by development of a bile leak requiring biliary stent placement. EXAM: ABDOMEN - 2 VIEW COMPARISON:  Nuclear medicine HIDA scan-12/15/2020; 12/12/2020; ERCP-12/12/2020 FINDINGS: Ingested enteric contrast is seen throughout the colon. There is no significant gaseous distension of the upstream small bowel. Cholecystectomy clips overlie the right upper abdominal quadrant. A biliary stent overlies expected location of the CBD. Small amount of air is seen within the gallbladder fossa, nonspecific though favored to be secondary to a residual cystic duct leak in the setting of a biliary stent, less likely pneumoperitoneum given lack of free air seen below either hemidiaphragm. Limited visualization of the lower thorax demonstrates a trace left-sided effusion and bibasilar heterogeneous opacities. No acute osseous abnormalities. IMPRESSION: 1. Small amount of air  within the gallbladder fossa favored to be secondary to residual cystic duct leak in the setting of a biliary stent, less likely pneumoperitoneum given lack of free air below either  hemidiaphragm. Further evaluation with abdominal CT could be performed as indicated. 2. Enteric contrast seen throughout the colon. No evidence of enteric obstruction. 3. Trace left-sided and bibasilar opacities, atelectasis versus infiltrate. Electronically Signed   By: Simonne Come M.D.   On: 12/17/2020 09:14   DG C-Arm 1-60 Min-No Report  Result Date: 12/12/2020 Fluoroscopy was utilized by the requesting physician.  No radiographic interpretation.   MR ABDOMEN MRCP W WO CONTAST  Result Date: 12/12/2020 CLINICAL DATA:  Abdominal pain, suspected biliary duct obstruction. Post cholecystectomy. EXAM: MRI ABDOMEN WITH AND WITHOUT CONTRAST (WITH MRCP) TECHNIQUE: Multiplanar multisequence MR imaging of the abdomen was performed prior to and following the administration of intravenous contrast. Heavily T2-weighted images of the biliary and pancreatic ducts were obtained, and three-dimensional MRCP images were rendered by post processing. CONTRAST:  10mL GADAVIST GADOBUTROL 1 MMOL/ML IV SOLN COMPARISON:  CT abdomen and pelvis of Dec 12, 2020. FINDINGS: Lower chest: Dense basilar airspace disease at the RIGHT lung base with similar appearance to prior imaging obtained on the same date. Also with LEFT lower lobe airspace disease. Hepatobiliary: Perihepatic ascites. Small amount of fluid in the gallbladder fossa. The common bile duct is of normal caliber. No sign of filling defect in the biliary tree. No focal, suspicious hepatic lesion. Portal vein is patent. Hepatic veins are patent. On MRCP sequences fluid can be seen tracking from the gallbladder fossa incontinuity with small to moderate volume of perihepatic fluid. There is also interloop fluid in the upper abdomen adjacent to the RIGHT colon. This is best seen on T2 weighted imaging on image 37 of series 40 with respect to interloop fluid. Pancreas:  Normal, without mass, inflammation or ductal dilatation. Spleen:  Normal spleen. Adrenals/Urinary Tract: Adrenal  glands are normal. Symmetric renal enhancement. No hydronephrosis. Stomach/Bowel: No sign of acute gastrointestinal process to the extent evaluated on abdominal MRI. Vascular/Lymphatic: Patent abdominal vessels. There is no gastrohepatic or hepatoduodenal ligament lymphadenopathy. No retroperitoneal or mesenteric lymphadenopathy. Other: Perihepatic ascites and fluid tracking into the RIGHT upper quadrant in communication with fluid about the gallbladder fossa. Small amount of pneumoperitoneum as on the previous CT anterior to the liver margin. Musculoskeletal: No suspicious bone lesions identified. IMPRESSION: Small to moderate volume of perihepatic ascites slightly increased since the previous study. There is also some fluid and or small amount of blood inferior to the RIGHT hemi liver. While constellation of findings could be seen in the postoperative setting the slight interval increase in fluid about the liver, in communication with the gallbladder fossa in the setting of increasing bilirubin raises suspicion for biliary leak. Nuclear medicine hepatobiliary scan may be helpful to exclude the possibility of biliary leak. Dense basilar consolidative changes RIGHT greater than LEFT persist likely related to volume loss, difficult to exclude the possibility of developing infection, attention on follow-up. These results will be called to the ordering clinician or representative by the Radiologist Assistant, and communication documented in the PACS or [employer]. Electronically Signed   By: Donzetta Kohut M.D.   On: 12/12/2020 11:41   CT IMAGE GUIDED DRAINAGE BY PERCUTANEOUS CATHETER  Result Date: 12/19/2020 CLINICAL DATA:  Persistent gas and fluid collection in the gallbladder fossa post cholecystectomy. Persistent elevated white blood cell count. EXAM: CT GUIDED DRAINAGE OF RIGHT UPPER QUADRANT ABSCESS ANESTHESIA/SEDATION: Intravenous Fentanyl  and Versed 3mg  were administered as conscious sedation  during continuous monitoring of the patient's level of consciousness and physiological / cardiorespiratory status by the radiology RN, with a total moderate sedation time of 16 minutes. PROCEDURE: The procedure, risks, benefits, and alternatives were explained to the patient. Questions regarding the procedure were encouraged and answered. The patient understands and consents to the procedure. Select axial scans through the abdomen were obtained. The subhepatic gallbladder fossa collection was localized and an appropriate skin entry site was determined and marked. The operative field was prepped with chlorhexidinein a sterile fashion, and a sterile drape was applied covering the operative field. A sterile gown and sterile gloves were used for the procedure. Local anesthesia was provided with 1% Lidocaine. Under CT fluoroscopic guidance, 18 gauge trocar needle advanced into the collection using a sub costal approach. Amplatz guidewire advanced easily within the collection. Tract dilated to facilitate placement of a 12 French pigtail drain catheter, formed centrally within the collection. Approximately 10 mL of blood tinged bilious fluid were aspirated, sent for Gram stain and culture. CT confirms good catheter position. The catheter was secured externally with 0 Prolene suture and StatLock and placed to gravity drain bag. The patient tolerated the procedure well. COMPLICATIONS: None immediate FINDINGS: Subhepatic gas and fluid collection was localized. 12 French pigtail drain catheter placed as above. 10 mL blood tinged bilious aspirate sent for Gram stain and culture. IMPRESSION: 1. Technically successful CT-guided drain catheter placement into the gallbladder fossa. Electronically Signed   By: M.D.   On: 12/19/2020 15:09   NM HEPATOBILIARY LEAK (POST-SURGICAL)  Result Date: 12/15/2020 CLINICAL DATA:  Status post cholecystectomy, complicated by bile leak, status post ERCP stent placement EXAM: NUCLEAR  MEDICINE HEPATOBILIARY IMAGING TECHNIQUE: Sequential images of the abdomen were obtained out to 60 minutes following intravenous administration of radiopharmaceutical. RADIOPHARMACEUTICALS:  7.95 mCi Tc-68m  Choletec IV COMPARISON:  12/12/2020 FINDINGS: Prompt uptake and biliary excretion of activity by the liver is seen. Gallbladder is surgically absent. Biliary activity passes into small bowel, consistent with patent common bile duct. No evidence of extraluminal radiotracer accumulation. IMPRESSION: 1.  Negative examination for bile leak status post cholecystectomy. 2.  The common bile duct is patent. Electronically Signed   By: 12/14/2020 M.D.   On: 12/15/2020 12:29   NM HEPATOBILIARY LEAK (POST-SURGICAL)  Result Date: 12/12/2020 CLINICAL DATA:  Status post cholecystectomy. Perihepatic fluid on recent MRI. Evaluate for postop bile leak. EXAM: NUCLEAR MEDICINE HEPATOBILIARY IMAGING TECHNIQUE: Sequential images of the abdomen were obtained out to 60 minutes following intravenous administration of radiopharmaceutical. RADIOPHARMACEUTICALS:  7.4 mCi Tc-53m  Choletec IV COMPARISON:  None. FINDINGS: Prompt uptake and biliary excretion of activity by the liver is seen. Patient is status post cholecystectomy. Accumulation of biliary activity is seen in the gallbladder fossa, which then shows progressive extension into the perihepatic spaces, consistent with postop bile leak. No biliary activity reaches the small bowel, however patency of the common bile duct cannot be determined due to the large bile leak. IMPRESSION: Large post-op bile leak. No biliary activity reaches small bowel, however common bile duct patency cannot be determined due to the large bowel loop. These results will be called to the ordering clinician or representative by the Radiologist Assistant, and communication documented in the PACS or 84m. Electronically Signed   By: [employer] M.D.   On: 12/12/2020 15:20   12/14/2020 Abdomen  Limited RUQ (LIVER/GB)  Result Date: 12/01/2020 CLINICAL DATA:  Epigastric pain  EXAM: ULTRASOUND ABDOMEN LIMITED RIGHT UPPER QUADRANT COMPARISON:  None. FINDINGS: Gallbladder: Cholelithiasis including an 8 mm stone at the neck. No gallbladder wall thickening but there is focal tenderness by sonographer exam. No pericholecystic fluid. Common bile duct: Diameter: 5 mm. Liver: Echogenic liver with diminished acoustic penetration. Portal vein is patent on color Doppler imaging with normal direction of blood flow towards the liver. Other: None. IMPRESSION: 1. Cholelithiasis including stone at the gallbladder neck. There is focal tenderness which could reflect gallbladder obstruction, but no wall thickening or edema typical of acute cholecystitis. 2. Hepatic steatosis. Electronically Signed   By: Marnee Spring M.D.   On: 12/01/2020 05:46      ASSESSMENT/PLAN    Acute febrile illness with hypoxemia   Due to Gall bladder abcess - s/p drainage -bibasilar atelectasis cannot rule out consolidation/infiltrate -on cipro will broaden to zosyn due to persistent fevers and trending up leucocytosis -respiratory infection workup thus far is negative, blood cultures in process.  -s/p HIDA -[place] abd complete in process- checking for nephrolithiasis -MRSA pcr-negative -REsp viral panel-negative - COVID19 negative - supplemental O2 during my evaluation 2L/min -legionella ab -strep pneumoniae ur AG-negative -sputum resp cultures -reviewed pertinent imaging with patient today -PT/OT for d/c planning  -US renal - mild ascites, GB surgical changes noted -LDH elevation - query due to pancreatitis/physical stress due to surgery    Acute Pancreatitis   - GB related   - note s/p ERCP -resolving - patient eating  -there is pleural effusion and atelectasis with abd asictes -    -good urine output and eating without nausea -LDH elevated    Opiod induced Constipation -resolved  s/p Dulcolax supposittory - +BM  overnight   Bibasilar atelectasis  - Incentive spirometer - patient takes tidal volume 1500cc   - patient will need MetaNEB with saline for recruitment      Thank you for allowing me to participate in the care of this patient.   Patient/Family are satisfied with care plan and all questions have been answered.  This document was prepared using Dragon voice recognition software and may include unintentional dictation errors.     Vida Rigger, M.D.  Division of Pulmonary & Critical Care Medicine  [facility]

## 2020-12-19 NOTE — Progress Notes (Signed)
Tmax 100.2.  Some distress w/ diet advance. Better today. Ambulating well. Lungs: Clear.Sats: 95% Cardio: RR. HR down.   ABD: Minimal distension, soft.  Reviewed CT w/ IR. Candidate for CT directed drainage. NPO until after exam.

## 2020-12-19 NOTE — TOC Initial Note (Signed)
Transition of Care [facility]) - Initial/Assessment Note    Patient Details  Name: Nicholas Gallagher MRN: [id number] Date of Birth: Dec 16, 1980  [facility]) CM/SW Contact:    Chapman Fitch, RN Phone Number: 12/19/2020, 3:30 PM  Clinical Narrative:                 post Abscess drain placement.  Bedside RN to provide drain education prior to discharge         Patient Goals and CMS Choice        Expected Discharge Plan and Services                                                Prior Living Arrangements/Services                       Activities of Daily Living Home Assistive Devices/Equipment: None ADL Screening (condition at time of admission) Patient's cognitive ability adequate to safely complete daily activities?: Yes Is the patient deaf or have difficulty hearing?: No Does the patient have difficulty seeing, even when wearing glasses/contacts?: No Does the patient have difficulty concentrating, remembering, or making decisions?: No Patient able to express need for assistance with ADLs?: Yes Does the patient have difficulty dressing or bathing?: No Independently performs ADLs?: Yes (appropriate for developmental age) Does the patient have difficulty walking or climbing stairs?: No Weakness of Legs: None Weakness of Arms/Hands: None  Permission Sought/Granted                  Emotional Assessment              Admission diagnosis:  Post-operative pain [G89.18] Elevated LFTs [R79.89] Right upper quadrant abdominal pain [R10.11] Patient Active Problem List   Diagnosis Date Noted  . Post-operative pain 12/12/2020  . Bile duct leak    PCP:  Marina Goodell, MD Pharmacy:   [facility] [phone] [facility], [place] - [address] AT [facility] [address] [facility] [place] [address] Phone: [phone] Fax: [phone]     Social Determinants of Health (SDOH) Interventions    Readmission Risk  Interventions No flowsheet data found.

## 2020-12-20 DIAGNOSIS — R509 Fever, unspecified: Secondary | ICD-10-CM

## 2020-12-20 DIAGNOSIS — K859 Acute pancreatitis without necrosis or infection, unspecified: Secondary | ICD-10-CM

## 2020-12-20 DIAGNOSIS — K812 Acute cholecystitis with chronic cholecystitis: Secondary | ICD-10-CM

## 2020-12-20 DIAGNOSIS — D72829 Elevated white blood cell count, unspecified: Secondary | ICD-10-CM

## 2020-12-20 LAB — CBC WITH DIFFERENTIAL/PLATELET
Abs Immature Granulocytes: 0.42 10*3/uL — ABNORMAL HIGH (ref 0.00–0.07)
Basophils Absolute: 0.1 10*3/uL (ref 0.0–0.1)
Basophils Relative: 1 %
Eosinophils Absolute: 0.1 10*3/uL (ref 0.0–0.5)
Eosinophils Relative: 1 %
HCT: 35.9 % — ABNORMAL LOW (ref 39.0–52.0)
Hemoglobin: 11.9 g/dL — ABNORMAL LOW (ref 13.0–17.0)
Immature Granulocytes: 2 %
Lymphocytes Relative: 9 %
Lymphs Abs: 1.8 10*3/uL (ref 0.7–4.0)
MCH: 29.5 pg (ref 26.0–34.0)
MCHC: 33.1 g/dL (ref 30.0–36.0)
MCV: 89.1 fL (ref 80.0–100.0)
Monocytes Absolute: 2.1 10*3/uL — ABNORMAL HIGH (ref 0.1–1.0)
Monocytes Relative: 11 %
Neutro Abs: 15.7 10*3/uL — ABNORMAL HIGH (ref 1.7–7.7)
Neutrophils Relative %: 76 %
Platelets: 585 10*3/uL — ABNORMAL HIGH (ref 150–400)
RBC: 4.03 MIL/uL — ABNORMAL LOW (ref 4.22–5.81)
RDW: 13 % (ref 11.5–15.5)
WBC: 20.2 10*3/uL — ABNORMAL HIGH (ref 4.0–10.5)
nRBC: 0 % (ref 0.0–0.2)

## 2020-12-20 LAB — CULTURE, BLOOD (ROUTINE X 2)
Culture: NO GROWTH
Culture: NO GROWTH
Special Requests: ADEQUATE

## 2020-12-20 LAB — POTASSIUM: Potassium: 3.6 mmol/L (ref 3.5–5.1)

## 2020-12-20 LAB — LIPASE, BLOOD: Lipase: 37 U/L (ref 11–51)

## 2020-12-20 MED ORDER — MELOXICAM 7.5 MG PO TABS
15.0000 mg | ORAL_TABLET | Freq: Every day | ORAL | Status: DC
  Administered 2020-12-20 – 2020-12-21 (×2): 15 mg via ORAL
  Filled 2020-12-20 (×2): qty 2

## 2020-12-20 MED ORDER — METOCLOPRAMIDE HCL 5 MG PO TABS
5.0000 mg | ORAL_TABLET | Freq: Three times a day (TID) | ORAL | Status: DC
  Administered 2020-12-20 (×3): 5 mg via ORAL
  Filled 2020-12-20 (×4): qty 1

## 2020-12-20 NOTE — Progress Notes (Signed)
Date of Admission:  12/11/2020      ID: Nicholas Gallagher is a 39 y.o. male Active Problems:   Post-operative pain   Bile duct leak    Subjective: Doing better Had rt upper quadrant drain yesterday No pain abdomen except at the site of the drain Appetite better- eating solids  Medications:  . ketorolac  15 mg Intravenous Q8H  . metoCLOPramide  5 mg Oral TID AC & HS  . potassium chloride  20 mEq Oral BID  . sodium chloride flush  5 mL Intracatheter Q8H    Objective: Vital signs in last 24 hours: Patient Vitals for the past 24 hrs:  BP Temp Temp src Pulse Resp SpO2  12/20/20 1544 138/89 98.1 F (36.7 C) Oral 83 20 99 %  12/20/20 1117 138/86 98.6 F (37 C) Oral 87 20 95 %  12/20/20 1017 136/87 98.5 F (36.9 C) Oral 80 17 95 %  12/20/20 0527 (!) 139/94 100.2 F (37.9 C) Oral 87 17 94 %  12/20/20 0005 137/87 99.2 F (37.3 C) Oral 83 18 95 %  12/19/20 2121 (!) 139/91 99.9 F (37.7 C) -- 84 17 97 %  PHYSICAL EXAM:  General: Alert, cooperative, no distress, appears stated age.  Head: Normocephalic, without obvious abnormality, atraumatic. Eyes: Conjunctivae clear, anicteric sclerae. Pupils are equal ENT Nares normal. No drainage or sinus tenderness. Lips, mucosa, and tongue normal. No Thrush Neck: Supple, symmetrical, no adenopathy, thyroid: non tender no carotid bruit and no JVD. Back: No CVA tenderness. Lungs: Clear to auscultation bilaterally. No Wheezing or Rhonchi. No rales. Heart: Regular rate and rhythm, no murmur, rub or gallop. Abdomen: Soft, rt upper quadrant drain Extremities: atraumatic, no cyanosis. No edema. No clubbing Skin: No rashes or lesions. Or bruising Lymph: Cervical, supraclavicular normal. Neurologic: Grossly non-focal  Lab Results Recent Labs    12/18/20 0451 12/19/20 1036 12/20/20 0442  WBC 24.2* 24.3* 20.2*  HGB 12.0* 12.3* 11.9*  HCT 35.3* 35.5* 35.9*  NA 139 138  --   K 3.4* 3.3*  --   CL 102 103  --   CO2 27 23  --   BUN 8 8  --    CREATININE 0.82 0.73  --    Liver Panel Recent Labs    12/18/20 0451 12/19/20 1036  PROT 6.2* 6.7  ALBUMIN 2.5* 2.6*  AST 39 46*  ALT 73* 74*  ALKPHOS 159* 202*  BILITOT 1.3* 0.9  BILIDIR 0.6*  --   IBILI 0.7  --    Sedimentation Rate No results for input(s): ESRSEDRATE in the last 72 hours. C-Reactive Protein No results for input(s): CRP in the last 72 hours.  Microbiology:  Studies/Results: CT CHEST ABDOMEN PELVIS W CONTRAST  Result Date: 12/18/2020 CLINICAL DATA:  Right upper quadrant pain, recent cholecystectomy. Pancreatitis. Common bile duct stent. EXAM: CT CHEST, ABDOMEN, AND PELVIS WITH CONTRAST TECHNIQUE: Multidetector CT imaging of the chest, abdomen and pelvis was performed following the standard protocol during bolus administration of intravenous contrast. CONTRAST:  OMNIPAQUE IOHEXOL 300 MG/ML  SOLN COMPARISON:  Ultrasound Dec 15, 2020, CT Dec 14, 2020 FINDINGS: CT CHEST FINDINGS Cardiovascular: No significant vascular findings. Normal heart size. Trace pericardial effusion, similar to prior. Mediastinum/Nodes: No discrete thyroid nodularity. No pathologically enlarged mediastinal, hilar or axillary lymph nodes. The trachea and esophagus are grossly unremarkable. Lungs/Pleura: Interval decrease in the small pleural effusions and bibasilar consolidations, with enhancement of the lung parenchyma suggestive of atelectasis as opposed to infiltrate. Musculoskeletal: No chest  wall mass or suspicious bone lesions identified. CT ABDOMEN PELVIS FINDINGS Hepatobiliary: No suspicious hepatic lesion. The gallbladder is surgically absent. Similar size of the now walled off gas and fluid collection within the cholecystectomy bed measuring 4.2 x 2.5 cm previously 4.4 x 2.3 cm on image 60/2. Stent is again visualized within the common bile duct with pigtail loop in the duodenum. No biliary ductal dilation or pneumobilia. Pancreas: Slightly increased peripancreatic edema particularly  along the pancreatic head with edema extending along the retroperitoneum and mesenteric root. Slight edematous appearance to the pancreatic head. No evidence of pancreatic necrosis. No walled off peripancreatic collections. No pancreatic ductal dilation. Spleen: Unremarkable. Adrenals/Urinary Tract: Adrenal glands are unremarkable. Kidneys are normal, without renal calculi, focal lesion, or hydronephrosis. Bladder is unremarkable. Stomach/Bowel: Mild wall thickening with adjacent inflammation of the gastric antrum and proximal duodenum. No pathologic dilation of small bowel. There is mild colonic wall thickening and adjacent inflammatory stranding involving the splenic flexure of the colon and to a lesser extent the hepatic flexure of the colon. Radiopaque enteric contrast visualized to the level of the rectum. Vascular/Lymphatic: No abdominal aortic aneurysm. The SMV, portal vein and splenic veins are patent. Prominent periportal and mesenteric lymph nodes which are likely reactive. Reproductive: Prostate is unremarkable. Other: Trace pelvic free fluid.  No pneumoperitoneum. Musculoskeletal: No acute osseous abnormality. IMPRESSION: 1. Postsurgical change of cholecystectomy with CBD stent placement, without biliary ductal dilation. Similar size of the now walled off gas and fluid collection with the cholecystectomy bed measuring 4.2 cm, the sterility of which cannot be assessed on CT. 2. Slightly increased peripancreatic edema particularly along the pancreatic head with edema extending along the retroperitoneum and mesenteric root. Slight edematous appearance to the pancreatic head. No evidence of pancreatic necrosis. No walled off peripancreatic collections. Constellation of findings which favored to represent evolving pancreatitis although a superimposed more acute component cannot be excluded by imaging. 3. Mild wall thickening with adjacent inflammation involving the gastric antrum, proximal duodenum, as well  as the splenic and hepatic flexures, which are favored reactive. 4. Interval decrease in the small pleural effusions and bibasilar consolidations, with enhancement of the lung parenchyma suggestive of atelectasis as opposed to infiltrate. Electronically Signed   By: Maudry Mayhew MD   On: 12/18/2020 15:28   CT IMAGE GUIDED DRAINAGE BY PERCUTANEOUS CATHETER  Result Date: 12/19/2020 CLINICAL DATA:  Persistent gas and fluid collection in the gallbladder fossa post cholecystectomy. Persistent elevated white blood cell count. EXAM: CT GUIDED DRAINAGE OF RIGHT UPPER QUADRANT ABSCESS ANESTHESIA/SEDATION: Intravenous Fentanyl and Versed 3mg  were administered as conscious sedation during continuous monitoring of the patient's level of consciousness and physiological / cardiorespiratory status by the radiology RN, with a total moderate sedation time of 16 minutes. PROCEDURE: The procedure, risks, benefits, and alternatives were explained to the patient. Questions regarding the procedure were encouraged and answered. The patient understands and consents to the procedure. Select axial scans through the abdomen were obtained. The subhepatic gallbladder fossa collection was localized and an appropriate skin entry site was determined and marked. The operative field was prepped with chlorhexidinein a sterile fashion, and a sterile drape was applied covering the operative field. A sterile gown and sterile gloves were used for the procedure. Local anesthesia was provided with 1% Lidocaine. Under CT fluoroscopic guidance, 18 gauge trocar needle advanced into the collection using a sub costal approach. Amplatz guidewire advanced easily within the collection. Tract dilated to facilitate placement of a 12 French pigtail drain catheter,  formed centrally within the collection. Approximately 10 mL of blood tinged bilious fluid were aspirated, sent for Gram stain and culture. CT confirms good catheter position. The catheter was  secured externally with 0 Prolene suture and StatLock and placed to gravity drain bag. The patient tolerated the procedure well. COMPLICATIONS: None immediate FINDINGS: Subhepatic gas and fluid collection was localized. 12 French pigtail drain catheter placed as above. 10 mL blood tinged bilious aspirate sent for Gram stain and culture. IMPRESSION: 1. Technically successful CT-guided drain catheter placement into the gallbladder fossa. Electronically Signed   By: Corlis Leak M.D.   On: 12/19/2020 15:09     Assessment/Plan: Acute on chronic cholecystitis and cholelithiasis status  postcholecystectomy on 12/11/2020  Biliary leak post surgery  Gallbladder fossa collection status post drain  Acute pancreatitis following ERCP  Abnormal LFT secondary to the above has resolved  Fever and leukocytosis is improved.  Patient was on Zosyn which has been stopped today by primary team as the biliary fluid had no WBC or bacteria.  Bibasilar atelectasis.  Improving on incentive spirometry Discussed the management with the patient and his wife. ID will sign off Call if needed.

## 2020-12-20 NOTE — Plan of Care (Signed)

## 2020-12-20 NOTE — Progress Notes (Signed)
Referring Physician(s): Dr. Lemar Livings   Supervising Physician: Richarda Overlie  Patient Status:  Nicholas [facility] - In-pt  Chief Complaint: Cholecystectomy with post-op bile leak and gallbladder fossa abscess; s/p GB fossa drain placement 12/19/20 with Dr. Deanne Coffer.   Subjective: Patient awake and alert in bed, RN at the bedside. Patient states he's feeling better and is tolerating a regular diet. He is hopefull to go home this weekend.   Allergies: Patient has no known allergies.  Medications: Prior to Admission medications   Medication Sig Start Date End Date Taking? Authorizing Provider  Multiple Vitamins-Minerals (ADULT GUMMY PO) Take 2 capsules by mouth daily.    [provider]  naproxen sodium (ALEVE) 220 MG tablet Take 220 mg by mouth 2 (two) times daily as needed (pain).    [provider]  ondansetron (ZOFRAN ODT) 4 MG disintegrating tablet Take 1 tablet (4 mg total) by mouth every 6 (six) hours as needed for nausea or vomiting. Patient not taking: No sig reported 12/01/20   Ward, Layla Maw, DO  oxyCODONE-acetaminophen (PERCOCET) 5-325 MG tablet Take 2 tablets by mouth every 6 (six) hours as needed for severe pain. Patient not taking: No sig reported 12/01/20 12/01/21  Ward, Layla Maw, DO     Vital Signs: BP 138/86 (BP Location: Right Arm)   Pulse 87   Temp 98.6 F (37 C) (Oral)   Resp 20   Ht 6\' 1"  (1.854 m)   Wt 225 lb 1.4 oz (102.1 kg)   SpO2 95%   BMI 29.70 kg/m   Physical Exam Constitutional:      General: He is not in acute distress.    Appearance: He is not ill-appearing.  Pulmonary:     Effort: Pulmonary effort is normal.  Abdominal:     Comments: RUQ drain to gravity bag. Scant amount of yellow/purulent material in bag. Drain recently flushed by RN who stated the drain flushed/aspirated easily. Dressing is clean and dry. Generalized abdominal tenderness.   Skin:    General: Skin is warm and dry.  Neurological:     Mental Status: He is alert and  oriented to person, place, and time.     Imaging: DG Chest 2 View  Result Date: 12/17/2020 CLINICAL DATA:  Post cholecystectomy complicated by development of a bile leak. EXAM: CHEST - 2 VIEW COMPARISON:  12/15/2020; abdominal radiograph-earlier same day FINDINGS: Grossly unchanged cardiac silhouette and mediastinal contours. Unchanged trace left-sided effusion and associated bibasilar opacities, right greater than left. Mild pulmonary venous congestion without frank evidence of edema. No acute osseous abnormalities. Small amount of air is seen within the gallbladder fossa, better demonstrated on dedicated abdominal radiographs. Post cholecystectomy. Biliary stent overlies expected location of the CBD. IMPRESSION: 1. Similar findings of cardiomegaly, trace left-sided pleural effusion and bibasilar heterogeneous opacities, atelectasis versus infiltrate. 2. Pulmonary venous congestion without frank evidence of edema. 3. Small amount of air within the gallbladder fossa as better demonstrated on dedicated abdominal radiographs performed earlier same day. Again, further evaluation with abdominal CT could be performed as indicated. Electronically Signed   By: 12/17/2020 M.D.   On: 12/17/2020 09:16   CT CHEST ABDOMEN PELVIS W CONTRAST  Result Date: 12/18/2020 CLINICAL DATA:  Right upper quadrant pain, recent cholecystectomy. Pancreatitis. Common bile duct stent. EXAM: CT CHEST, ABDOMEN, AND PELVIS WITH CONTRAST TECHNIQUE: Multidetector CT imaging of the chest, abdomen and pelvis was performed following the standard protocol during bolus administration of intravenous contrast. CONTRAST:  02/17/2021 OMNIPAQUE IOHEXOL 300 MG/ML  SOLN COMPARISON:  Ultrasound Dec 15, 2020, CT Dec 14, 2020 FINDINGS: CT CHEST FINDINGS Cardiovascular: No significant vascular findings. Normal heart size. Trace pericardial effusion, similar to prior. Mediastinum/Nodes: No discrete thyroid nodularity. No pathologically enlarged mediastinal,  hilar or axillary lymph nodes. The trachea and esophagus are grossly unremarkable. Lungs/Pleura: Interval decrease in the small pleural effusions and bibasilar consolidations, with enhancement of the lung parenchyma suggestive of atelectasis as opposed to infiltrate. Musculoskeletal: No chest wall mass or suspicious bone lesions identified. CT ABDOMEN PELVIS FINDINGS Hepatobiliary: No suspicious hepatic lesion. The gallbladder is surgically absent. Similar size of the now walled off gas and fluid collection within the cholecystectomy bed measuring 4.2 x 2.5 cm previously 4.4 x 2.3 cm on image 60/2. Stent is again visualized within the common bile duct with pigtail loop in the duodenum. No biliary ductal dilation or pneumobilia. Pancreas: Slightly increased peripancreatic edema particularly along the pancreatic head with edema extending along the retroperitoneum and mesenteric root. Slight edematous appearance to the pancreatic head. No evidence of pancreatic necrosis. No walled off peripancreatic collections. No pancreatic ductal dilation. Spleen: Unremarkable. Adrenals/Urinary Tract: Adrenal glands are unremarkable. Kidneys are normal, without renal calculi, focal lesion, or hydronephrosis. Bladder is unremarkable. Stomach/Bowel: Mild wall thickening with adjacent inflammation of the gastric antrum and proximal duodenum. No pathologic dilation of small bowel. There is mild colonic wall thickening and adjacent inflammatory stranding involving the splenic flexure of the colon and to a lesser extent the hepatic flexure of the colon. Radiopaque enteric contrast visualized to the level of the rectum. Vascular/Lymphatic: No abdominal aortic aneurysm. The SMV, portal vein and splenic veins are patent. Prominent periportal and mesenteric lymph nodes which are likely reactive. Reproductive: Prostate is unremarkable. Other: Trace pelvic free fluid.  No pneumoperitoneum. Musculoskeletal: No acute osseous abnormality.  IMPRESSION: 1. Postsurgical change of cholecystectomy with CBD stent placement, without biliary ductal dilation. Similar size of the now walled off gas and fluid collection with the cholecystectomy bed measuring 4.2 cm, the sterility of which cannot be assessed on CT. 2. Slightly increased peripancreatic edema particularly along the pancreatic head with edema extending along the retroperitoneum and mesenteric root. Slight edematous appearance to the pancreatic head. No evidence of pancreatic necrosis. No walled off peripancreatic collections. Constellation of findings which favored to represent evolving pancreatitis although a superimposed more acute component cannot be excluded by imaging. 3. Mild wall thickening with adjacent inflammation involving the gastric antrum, proximal duodenum, as well as the splenic and hepatic flexures, which are favored reactive. 4. Interval decrease in the small pleural effusions and bibasilar consolidations, with enhancement of the lung parenchyma suggestive of atelectasis as opposed to infiltrate. Electronically Signed   By: Maudry Mayhew MD   On: 12/18/2020 15:28   DG Abd 2 Views  Result Date: 12/17/2020 CLINICAL DATA:  Recent cholecystectomy complicated by development of a bile leak requiring biliary stent placement. EXAM: ABDOMEN - 2 VIEW COMPARISON:  Nuclear medicine HIDA scan-12/15/2020; 12/12/2020; ERCP-12/12/2020 FINDINGS: Ingested enteric contrast is seen throughout the colon. There is no significant gaseous distension of the upstream small bowel. Cholecystectomy clips overlie the right upper abdominal quadrant. A biliary stent overlies expected location of the CBD. Small amount of air is seen within the gallbladder fossa, nonspecific though favored to be secondary to a residual cystic duct leak in the setting of a biliary stent, less likely pneumoperitoneum given lack of free air seen below either hemidiaphragm. Limited visualization of the lower thorax demonstrates a  trace left-sided effusion and bibasilar  heterogeneous opacities. No acute osseous abnormalities. IMPRESSION: 1. Small amount of air within the gallbladder fossa favored to be secondary to residual cystic duct leak in the setting of a biliary stent, less likely pneumoperitoneum given lack of free air below either hemidiaphragm. Further evaluation with abdominal CT could be performed as indicated. 2. Enteric contrast seen throughout the colon. No evidence of enteric obstruction. 3. Trace left-sided and bibasilar opacities, atelectasis versus infiltrate. Electronically Signed   By: Simonne ComeJohn  Watts M.D.   On: 12/17/2020 09:14   CT IMAGE GUIDED DRAINAGE BY PERCUTANEOUS CATHETER  Result Date: 12/19/2020 CLINICAL DATA:  Persistent gas and fluid collection in the gallbladder fossa post cholecystectomy. Persistent elevated white blood cell count. EXAM: CT GUIDED DRAINAGE OF RIGHT UPPER QUADRANT ABSCESS ANESTHESIA/SEDATION: Intravenous Fentanyl 100mcg and Versed 3mg  were administered as conscious sedation during continuous monitoring of the patient's level of consciousness and physiological / cardiorespiratory status by the radiology RN, with a total moderate sedation time of 16 minutes. PROCEDURE: The procedure, risks, benefits, and alternatives were explained to the patient. Questions regarding the procedure were encouraged and answered. The patient understands and consents to the procedure. Select axial scans through the abdomen were obtained. The subhepatic gallbladder fossa collection was localized and an appropriate skin entry site was determined and marked. The operative field was prepped with chlorhexidinein a sterile fashion, and a sterile drape was applied covering the operative field. A sterile gown and sterile gloves were used for the procedure. Local anesthesia was provided with 1% Lidocaine. Under CT fluoroscopic guidance, 18 gauge trocar needle advanced into the collection using a sub costal approach. Amplatz  guidewire advanced easily within the collection. Tract dilated to facilitate placement of a 12 French pigtail drain catheter, formed centrally within the collection. Approximately 10 mL of blood tinged bilious fluid were aspirated, sent for Gram stain and culture. CT confirms good catheter position. The catheter was secured externally with 0 Prolene suture and StatLock and placed to gravity drain bag. The patient tolerated the procedure well. COMPLICATIONS: None immediate FINDINGS: Subhepatic gas and fluid collection was localized. 12 French pigtail drain catheter placed as above. 10 mL blood tinged bilious aspirate sent for Gram stain and culture. IMPRESSION: 1. Technically successful CT-guided drain catheter placement into the gallbladder fossa. Electronically Signed   By: Corlis Leak  Hassell M.D.   On: 12/19/2020 15:09    Labs:  CBC: Recent Labs    12/17/20 0601 12/18/20 0451 12/19/20 1036 12/20/20 0442  WBC 28.9* 24.2* 24.3* 20.2*  HGB 11.9* 12.0* 12.3* 11.9*  HCT 34.8* 35.3* 35.5* 35.9*  PLT 436* 478* 607* 585*    COAGS: Recent Labs    12/19/20 1036  INR 1.1    BMP: Recent Labs    12/15/20 0617 12/16/20 0640 12/18/20 0451 12/19/20 1036  NA 130* 133* 139 138  K 4.2 3.5 3.4* 3.3*  CL 92* 96* 102 103  CO2 27 26 27 23   GLUCOSE 138* 121* 111* 148*  BUN 7 9 8 8   CALCIUM 8.5* 8.2* 8.3* 8.4*  CREATININE 0.89 0.73 0.82 0.73  GFRNONAA >60 >60 >60 >60    LIVER FUNCTION TESTS: Recent Labs    12/15/20 0617 12/16/20 0640 12/18/20 0451 12/19/20 1036  BILITOT 2.1* 1.7* 1.3* 0.9  AST 33 22 39 46*  ALT 213* 125* 73* 74*  ALKPHOS 126 117 159* 202*  PROT 6.8 6.6 6.2* 6.7  ALBUMIN 3.1* 2.8* 2.5* 2.6*    Assessment and Plan:  Cholecystectomy with post-op bile leak and gallbladder  fossa abscess; s/p GB fossa drain placement 12/19/20  Patient is afebrile; no new labs obtained today. Patient reports feeling better and has an increased appetite. Per Epic, drain output is 65 ml since  drain was placed. Patient has tentative discharge plans this weekend and outpatient orders have been placed. Patient will follow up at our clinic in 7-10 days after discharge. Drain care teaching has been done by bedside RN and patient verbalized understanding of flushing the drain once daily with NS and tracking output. The patient knows he can call our clinic with any questions/concerns.   Other plans per primary teams.   Electronically Signed: Alwyn Ren, AGACNP-BC [phone] 12/20/2020, 1:39 PM   I spent a total of 15 Minutes at the the patient's bedside AND on the patient's [facility] floor or unit, greater than 50% of which was counseling/coordinating care for gallbladder fossa abscess drain.

## 2020-12-21 MED ORDER — SODIUM CHLORIDE 0.9 % IJ SOLN: INTRAMUSCULAR | 3 refills | Status: AC

## 2020-12-21 NOTE — Progress Notes (Addendum)
Pt vitals and assessment stable with pain 1-2 after ambulating. Verified d/c home order. Education provided to patient/wife regarding flushing tube, dressing change- wife demonstrated competently. AVS instructions reviewed with patient/wife as well as follow up instructions. Supplies sent with patient/wife for dressing changes, flushing tube. Will follow up Tuesday with Dr. Birdie Sons. Patient departed, ambulated down hall to elevators with wife- wife to drive patient home.

## 2020-12-21 NOTE — Final Progress Note (Signed)
Afebrile, vital signs stable.  Loose bowel movements likely related to Reglan.  Pain usually peaks on awakening, rapidly improves with ambulation.  No narcotics of late.  Lungs clear.  Cardio regular rhythm.  Abdomen, no significant distention.  Soft.  Percutaneous drain site dressing clean.  Extremities: Soft.  Percutaneous drain: 150 cc golden bile over 24 hours.  ID notes from yesterday reviewed.  Case reviewed with pulmonary.  Will discharge home on a low-fat diet.  Discontinue Reglan.  Potassium replenished.  Biliary drainage may be from accessory ducts of Luschka, and drainage should resolve over time.  Previous HIDA scan showed no evidence of leak.  CT has shown only edematous pancreatitis.  Lipase has been normal for a week.  Will hopefully resolve without pseudocyst formation.  Dietary plan reviewed, low-fat diet.  Activity: As tolerated.    Office follow-up in 3 days.

## 2020-12-23 ENCOUNTER — Other Ambulatory Visit: Payer: Self-pay | Admitting: General Surgery

## 2020-12-23 DIAGNOSIS — R1011 Right upper quadrant pain: Secondary | ICD-10-CM

## 2020-12-25 ENCOUNTER — Other Ambulatory Visit: Payer: Self-pay | Admitting: General Surgery

## 2020-12-25 DIAGNOSIS — T8143XA Infection following a procedure, organ and space surgical site, initial encounter: Secondary | ICD-10-CM

## 2020-12-25 DIAGNOSIS — R1011 Right upper quadrant pain: Secondary | ICD-10-CM

## 2020-12-25 LAB — AEROBIC/ANAEROBIC CULTURE W GRAM STAIN (SURGICAL/DEEP WOUND)
Culture: NO GROWTH
Gram Stain: NONE SEEN

## 2020-12-30 NOTE — Anesthesia Postprocedure Evaluation (Signed)
Anesthesia Post Note  Patient: Darden Flemister  Procedure(s) Performed: ENDOSCOPIC RETROGRADE CHOLANGIOPANCREATOGRAPHY (ERCP)  Patient location during evaluation: Endoscopy Anesthesia Type: General Level of consciousness: awake and alert and oriented Pain management: pain level controlled Vital Signs Assessment: post-procedure vital signs reviewed and stable Respiratory status: spontaneous breathing Cardiovascular status: blood pressure returned to baseline Anesthetic complications: no   No notable events documented.   Last Vitals:  Vitals:   12/21/20 0453 12/21/20 0800  BP: (!) 132/91 137/86  Pulse: 78 88  Resp: 16 16  Temp: 37.1 C 36.7 C  SpO2: 97% 96%    Last Pain:  Vitals:   12/21/20 0453  TempSrc: Oral  PainSc:                  Augusta Hilbert

## 2020-12-31 ENCOUNTER — Other Ambulatory Visit: Payer: Self-pay | Admitting: General Surgery

## 2020-12-31 DIAGNOSIS — R1011 Right upper quadrant pain: Secondary | ICD-10-CM

## 2021-01-02 NOTE — Discharge Summary (Signed)
Physician Discharge Summary  Patient ID: Nicholas Gallagher MRN: [id number] DOB/AGE: September 20, 1980 40 y.o.  Admit date: 12/11/2020 Discharge date: 01/02/2021  Admission Diagnoses: Acute on chronic cholecystitis and cholelithiasis.  Discharge Diagnoses:  Active Problems:   Post-operative pain   Bile duct leak   Discharged Condition: good  Hospital Course:   Consults: ID, GI, and medicine  Significant Diagnostic Studies: microbiology: blood culture: negative and wound culture: negative, radiology:  ERCP , and endoscopy: ERCP: Stent placement  Treatments: Percutaneous drainage of subhepatic biloma.  Discharge Exam: Blood pressure 137/86, pulse 88, temperature 98 F (36.7 C), resp. rate 16, height 6\' 1"  (1.854 m), weight 102.1 kg, SpO2 96 %. General appearance: alert and cooperative Resp: clear to auscultation bilaterally Cardio: regular rate and rhythm, S1, S2 normal, no murmur, click, rub or gallop GI: soft, non-tender; bowel sounds normal; no masses,  no organomegaly  Disposition: Discharge disposition: 01-Home or Self Care       Discharge Instructions     Diet - low sodium heart healthy   Complete by: As directed    Discharge instructions   Complete by: As directed    Record drainage 2-3 times per day.  Bring record to office visits.  Tylenol/Advil/Aleve: If needed for soreness.  Okay to shower.  Drive when pain-free.  Activity as tolerated.  Low-fat diet.   Discharge wound care:   Complete by: As directed    Leave drain site dressing in place unless soiled/loose.  Recover with waterproof dressing if needed.   Increase activity slowly   Complete by: As directed       Allergies as of 12/21/2020   No Known Allergies      Medication List     STOP taking these medications    ciprofloxacin 500 MG tablet Commonly known as: CIPRO   oxyCODONE-acetaminophen 5-325 MG tablet Commonly known as: Percocet       TAKE these medications    ADULT GUMMY PO Take  2 capsules by mouth daily.   naproxen sodium 220 MG tablet Commonly known as: ALEVE Take 220 mg by mouth 2 (two) times daily as needed (pain).   ondansetron 4 MG disintegrating tablet Commonly known as: Zofran ODT Take 1 tablet (4 mg total) by mouth every 6 (six) hours as needed for nausea or vomiting.   sodium chloride 0.9 % injection 5 cc into drain catheter three times a day.               Discharge Care Instructions  (From admission, onward)           Start     Ordered   12/21/20 0000  Discharge wound care:       Comments: Leave drain site dressing in place unless soiled/loose.  Recover with waterproof dressing if needed.   12/21/20 0818            Follow-up Information     [facility] Follow up.   Why: A follow up appointment will be scheduled approximately 7-10 days after hospital discharge. A scheduler from our clinic will call you with the date/time. Please call our clinic with any questions prior to your appointment.  Contact information: [address] [place] [address]         [phone], MD Follow up.   Specialties: General Surgery, Radiology Why: Tuesday, June 7 at 10 AM.  Contact information: [address] [place] [phone]  [name], MD .   Specialty: Psychology Contact information: [address] [place] [address] [phone]                 Signed: Earline Mayotte 01/02/2021, 9:23 AM

## 2021-01-03 ENCOUNTER — Ambulatory Visit
Admission: RE | Admit: 2021-01-03 | Discharge: 2021-01-03 | Disposition: A | Discharge status: Home / Self Care | Payer: BC Managed Care – PPO | Source: Ambulatory Visit | Attending: General Surgery | Admitting: General Surgery

## 2021-01-03 ENCOUNTER — Other Ambulatory Visit: Payer: Self-pay

## 2021-01-03 DIAGNOSIS — G8929 Other chronic pain: Secondary | ICD-10-CM | POA: Insufficient documentation

## 2021-01-03 DIAGNOSIS — R1011 Right upper quadrant pain: Secondary | ICD-10-CM | POA: Insufficient documentation

## 2021-01-03 MED ORDER — SODIUM CHLORIDE (PF) 0.9 % IJ SOLN: 10.0000 mL | INTRAMUSCULAR | Status: DC | PRN

## 2021-01-03 MED ORDER — IOHEXOL 300 MG/ML  SOLN
25.0000 mL | Freq: Once | INTRAMUSCULAR | Status: AC | PRN
  Administered 2021-01-03: 15 mL via INTRATHECAL

## 2021-01-08 ENCOUNTER — Inpatient Hospital Stay: Admission: RE | Admit: 2021-01-08 | Payer: BC Managed Care – PPO | Source: Ambulatory Visit

## 2021-01-08 ENCOUNTER — Other Ambulatory Visit: Payer: BC Managed Care – PPO

## 2021-01-22 ENCOUNTER — Other Ambulatory Visit: Payer: Self-pay

## 2021-01-22 DIAGNOSIS — Z4582 Encounter for adjustment or removal of myringotomy device (stent) (tube): Secondary | ICD-10-CM

## 2021-01-28 ENCOUNTER — Encounter
Admission: RE | Disposition: A | Discharge status: Home / Self Care | Payer: Self-pay | Source: Home / Self Care | Attending: Gastroenterology

## 2021-01-28 ENCOUNTER — Ambulatory Visit
Admission: RE | Admit: 2021-01-28 | Discharge: 2021-01-28 | Disposition: A | Discharge status: Home / Self Care | Payer: BC Managed Care – PPO | Attending: Gastroenterology | Admitting: Gastroenterology

## 2021-01-28 ENCOUNTER — Ambulatory Visit: Payer: BC Managed Care – PPO | Admitting: Anesthesiology

## 2021-01-28 ENCOUNTER — Ambulatory Visit: Payer: BC Managed Care – PPO

## 2021-01-28 ENCOUNTER — Encounter: Payer: Self-pay | Admitting: Gastroenterology

## 2021-01-28 DIAGNOSIS — Z4659 Encounter for fitting and adjustment of other gastrointestinal appliance and device: Secondary | ICD-10-CM | POA: Insufficient documentation

## 2021-01-28 DIAGNOSIS — Z4582 Encounter for adjustment or removal of myringotomy device (stent) (tube): Secondary | ICD-10-CM

## 2021-01-28 HISTORY — PX: ERCP: SHX5425

## 2021-01-28 SURGERY — ERCP, WITH INTERVENTION IF INDICATED
Anesthesia: General

## 2021-01-28 MED ORDER — MIDAZOLAM HCL 5 MG/5ML IJ SOLN
INTRAMUSCULAR | Status: DC | PRN
  Administered 2021-01-28: 2 mg via INTRAVENOUS

## 2021-01-28 MED ORDER — PROPOFOL 10 MG/ML IV BOLUS
INTRAVENOUS | Status: DC | PRN
  Administered 2021-01-28 (×2): 100 mg via INTRAVENOUS

## 2021-01-28 MED ORDER — GLYCOPYRROLATE 0.2 MG/ML IJ SOLN
INTRAMUSCULAR | Status: DC | PRN
  Administered 2021-01-28: .2 mg via INTRAVENOUS

## 2021-01-28 MED ORDER — LACTATED RINGERS IV SOLN
INTRAVENOUS | Status: DC
  Administered 2021-01-28: 1000 mL via INTRAVENOUS

## 2021-01-28 MED ORDER — GLYCOPYRROLATE 0.2 MG/ML IJ SOLN
INTRAMUSCULAR | Status: AC
  Filled 2021-01-28: qty 1

## 2021-01-28 MED ORDER — SODIUM CHLORIDE 0.9 % IV SOLN: INTRAVENOUS | Status: DC

## 2021-01-28 MED ORDER — MIDAZOLAM HCL 2 MG/2ML IJ SOLN
INTRAMUSCULAR | Status: AC
  Filled 2021-01-28: qty 2

## 2021-01-28 MED ORDER — LIDOCAINE 2% (20 MG/ML) 5 ML SYRINGE
INTRAMUSCULAR | Status: DC | PRN
  Administered 2021-01-28: 25 mg via INTRAVENOUS

## 2021-01-28 MED ORDER — PROPOFOL 500 MG/50ML IV EMUL
INTRAVENOUS | Status: DC | PRN
  Administered 2021-01-28: 120 ug/kg/min via INTRAVENOUS

## 2021-01-28 NOTE — Op Note (Signed)
[facility]astroenterology Patient Name: Nicholas Gallagher Procedure Date: 01/28/2021 11:18 AM MRN: [id number] Account #: [id number] Date of Birth: October 04, 1980 Admit Type: Outpatient Age: 40 Room: [facility] ENDO ROOM 4 Gender: Male Note Status: Finalized Procedure:             ERCP Indications:           Stent removal Providers:             Midge Minium MD, MD Referring MD:          Marina Goodell (Referring MD) Medicines:             Propofol per Anesthesia Complications:         No immediate complications. Procedure:             Pre-Anesthesia Assessment:                        - Prior to the procedure, a History and Physical was                         performed, and patient medications and allergies were                         reviewed. The patient's tolerance of previous                         anesthesia was also reviewed. The risks and benefits                         of the procedure and the sedation options and risks                         were discussed with the patient. All questions were                         answered, and informed consent was obtained. Prior                         Anticoagulants: The patient has taken no previous                         anticoagulant or antiplatelet agents. ASA Grade                         Assessment: II - A patient with mild systemic disease.                         After reviewing the risks and benefits, the patient                         was deemed in satisfactory condition to undergo the                         procedure.                        After obtaining informed consent, the scope was passed  under direct vision. Throughout the procedure, the                         patient's blood pressure, pulse, and oxygen                         saturations were monitored continuously. The [employer] D single use duodenoscope was                          introduced through the mouth, and used to inject                         contrast into and used to inject contrast into the                         bile duct. The ERCP was accomplished without                         difficulty. The patient tolerated the procedure well. Findings:      One stent was removed from the common hepatic duct using a snare. A wire       was passed into the biliary tree. The biliary tree was swept with a 15       mm balloon starting at the bifurcation. Sludge was swept from the duct.       No further leak seen. Impression:            - One stent was removed from the common hepatic duct.                        - The biliary tree was swept and sludge was found. Recommendation:        - Discharge patient to home.                        - Resume previous diet.                        - Continue present medications. Procedure Code(s):     --- Professional ---                        [phone], Endoscopic retrograde cholangiopancreatography                         (ERCP); with removal of foreign body(s) or stent(s)                         from biliary/pancreatic duct(s)                        43264, Endoscopic retrograde cholangiopancreatography                         (ERCP); with removal of calculi/debris from                         biliary/pancreatic duct(s) Diagnosis Code(s):     ---  Professional ---                        Z46.59, Encounter for fitting and adjustment of other                         gastrointestinal appliance and device CPT copyright 2019 American Medical Association. All rights reserved. The codes documented in this report are preliminary and upon coder review may  be revised to meet current compliance requirements. Midge Minium MD, MD 01/28/2021 12:13:21 PM This report has been signed electronically. Number of Addenda: 0 Note Initiated On: 01/28/2021 11:18 AM Estimated Blood Loss:  Estimated blood loss: none.      [facility]

## 2021-01-28 NOTE — Anesthesia Postprocedure Evaluation (Signed)
Anesthesia Post Note  Patient: Nicholas Gallagher  Procedure(s) Performed: ENDOSCOPIC RETROGRADE CHOLANGIOPANCREATOGRAPHY (ERCP)  Patient location during evaluation: Endoscopy Anesthesia Type: General Level of consciousness: awake and alert Pain management: pain level controlled Vital Signs Assessment: post-procedure vital signs reviewed and stable Respiratory status: spontaneous breathing, nonlabored ventilation, respiratory function stable and patient connected to nasal cannula oxygen Cardiovascular status: blood pressure returned to baseline and stable Postop Assessment: no apparent nausea or vomiting Anesthetic complications: no   No notable events documented.   Last Vitals:  Vitals:   01/28/21 1223 01/28/21 1233  BP:    Pulse:    Resp: 18 18  Temp:    SpO2:      Last Pain:  Vitals:   01/28/21 1233  TempSrc:   PainSc: 0-No pain                 Cleda Mccreedy Other Atienza

## 2021-01-28 NOTE — Transfer of Care (Signed)
Immediate Anesthesia Transfer of Care Note  Patient: Nicholas Gallagher  Procedure(s) Performed: ENDOSCOPIC RETROGRADE CHOLANGIOPANCREATOGRAPHY (ERCP)  Patient Location: Endoscopy Unit  Anesthesia Type:General  Level of Consciousness: awake and alert   Airway & Oxygen Therapy: Patient Spontanous Breathing  Post-op Assessment: Report given to RN and Post -op Vital signs reviewed and stable  Post vital signs: Reviewed  Last Vitals:  Vitals Value Taken Time  BP 120/92 01/28/21 1213  Temp    Pulse 83 01/28/21 1214  Resp    SpO2 96 % 01/28/21 1214  Vitals shown include unvalidated device data.  Last Pain:  Vitals:   01/28/21 1039  TempSrc: Temporal  PainSc: 0-No pain         Complications: No notable events documented.

## 2021-01-28 NOTE — Anesthesia Preprocedure Evaluation (Signed)
Anesthesia Evaluation  Patient identified by MRN, date of birth, ID band Patient awake    Reviewed: Allergy & Precautions, NPO status , Patient's Chart, lab work & pertinent test results  History of Anesthesia Complications Negative for: history of anesthetic complications  Airway Mallampati: III  TM Distance: >3 FB Neck ROM: full    Dental  (+) Chipped   Pulmonary neg pulmonary ROS, neg shortness of breath, neg sleep apnea,    Pulmonary exam normal        Cardiovascular Exercise Tolerance: Good (-) angina(-) Past MI negative cardio ROS Normal cardiovascular exam     Neuro/Psych negative neurological ROS  negative psych ROS   GI/Hepatic negative GI ROS, Neg liver ROS, neg GERD  ,  Endo/Other  negative endocrine ROS  Renal/GU negative Renal ROS  negative genitourinary   Musculoskeletal   Abdominal   Peds  Hematology negative hematology ROS (+)   Anesthesia Other Findings Past Medical History: No date: Gallstones  Past Surgical History: 12/11/2020: CHOLECYSTECTOMY; N/A     Comment:  Procedure: LAPAROSCOPIC CHOLECYSTECTOMY WITH               INTRAOPERATIVE CHOLANGIOGRAM;  Surgeon: Earline Mayotte, MD;  Location: [facility] ORS;  Service: General;                Laterality: N/A; 12/12/2020: ERCP; N/A     Comment:  Procedure: ENDOSCOPIC RETROGRADE               CHOLANGIOPANCREATOGRAPHY (ERCP);  Surgeon: Midge Minium,               MD;  Location: [facility];  Service: Endoscopy;                Laterality: N/A; No date: NO PAST SURGERIES  BMI    Body Mass Index: 29.03 kg/m      Reproductive/Obstetrics negative OB ROS                             Anesthesia Physical Anesthesia Plan  ASA: 2  Anesthesia Plan: General   Post-op Pain Management:    Induction: Intravenous  PONV Risk Score and Plan: Propofol infusion and TIVA  Airway Management Planned: Natural  Airway and Nasal Cannula  Additional Equipment:   Intra-op Plan:   Post-operative Plan:   Informed Consent: I have reviewed the patients History and Physical, chart, labs and discussed the procedure including the risks, benefits and alternatives for the proposed anesthesia with the patient or authorized representative who has indicated his/her understanding and acceptance.     Dental Advisory Given  Plan Discussed with: Anesthesiologist, CRNA and Surgeon  Anesthesia Plan Comments: (Patient consented for risks of anesthesia including but not limited to:  - adverse reactions to medications - risk of airway placement if required - damage to eyes, teeth, lips or other oral mucosa - nerve damage due to positioning  - sore throat or hoarseness - Damage to heart, brain, nerves, lungs, other parts of body or loss of life  Patient voiced understanding.)        Anesthesia Quick Evaluation

## 2021-01-28 NOTE — H&P (Signed)
Midge Minium, MD [facility] [address], [address] [place], [place] [address] Phone:[phone] Fax : [phone]  Primary Care Physician:  Marina Goodell, MD Primary Gastroenterologist:  Dr. Servando Snare  Pre-Procedure History & Physical: HPI:  Nicholas Gallagher is a 40 y.o. male is here for an ERCP.   Past Medical History:  Diagnosis Date   Gallstones     Past Surgical History:  Procedure Laterality Date   CHOLECYSTECTOMY N/A 12/11/2020   Procedure: LAPAROSCOPIC CHOLECYSTECTOMY WITH INTRAOPERATIVE CHOLANGIOGRAM;  Surgeon: Earline Mayotte, MD;  Location: [facility] ORS;  Service: General;  Laterality: N/A;   ERCP N/A 12/12/2020   Procedure: ENDOSCOPIC RETROGRADE CHOLANGIOPANCREATOGRAPHY (ERCP);  Surgeon: Midge Minium, MD;  Location: [facility] ENDOSCOPY;  Service: Endoscopy;  Laterality: N/A;   NO PAST SURGERIES      Prior to Admission medications   Medication Sig Start Date End Date Taking? Authorizing Provider  Multiple Vitamins-Minerals (ADULT GUMMY PO) Take 2 capsules by mouth daily.   Yes [provider]  naproxen sodium (ALEVE) 220 MG tablet Take 220 mg by mouth 2 (two) times daily as needed (pain).   Yes [provider]  ondansetron (ZOFRAN ODT) 4 MG disintegrating tablet Take 1 tablet (4 mg total) by mouth every 6 (six) hours as needed for nausea or vomiting. Patient not taking: No sig reported 12/01/20   Ward, Baxter Hire N, DO  sodium chloride 0.9 % injection 5 cc into drain catheter three times a day. 12/21/20   Earline Mayotte, MD    Allergies as of 01/23/2021   (No Known Allergies)    History reviewed. No pertinent family history.  Social History   Socioeconomic History   Marital status: Married    Spouse name: [name]   Number of children: 2   Years of education: Not on file   Highest education level: Not on file  Occupational History   Not on file  Tobacco Use   Smoking status: Never   Smokeless tobacco: Never  Vaping Use   Vaping Use: Never used   Substance and Sexual Activity   Alcohol use: Yes    Comment: occassional   Drug use: Never   Sexual activity: Yes  Other Topics Concern   Not on file  Social History Narrative   Not on file   Social Determinants of Health   Financial Resource Strain: Not on file  Food Insecurity: Not on file  Transportation Needs: Not on file  Physical Activity: Not on file  Stress: Not on file  Social Connections: Not on file  Intimate Partner Violence: Not on file    Review of Systems: See HPI, otherwise negative ROS  Physical Exam: BP 121/89   Pulse 79   Temp (!) 97.2 F (36.2 C) (Temporal)   Resp 18   Ht 6\' 1"  (1.854 m)   Wt 99.8 kg   SpO2 98%   BMI 29.03 kg/m  General:   Alert,  pleasant and cooperative in NAD Head:  Normocephalic and atraumatic. Neck:  Supple; no masses or thyromegaly. Lungs:  Clear throughout to auscultation.    Heart:  Regular rate and rhythm. Abdomen:  Soft, nontender and nondistended. Normal bowel sounds, without guarding, and without rebound.   Neurologic:  Alert and  oriented x4;  grossly normal neurologically.  Impression/Plan: Nicholas Gallagher is here for an ERCP to be performed for stent removal  Risks, benefits, limitations, and alternatives regarding  ERCP have been reviewed with the patient.  Questions have been answered.  All parties agreeable.  Midge Minium, MD  01/28/2021, 11:50 AM

## 2021-01-29 ENCOUNTER — Encounter: Payer: Self-pay | Admitting: Gastroenterology
